# Patient Record
Sex: Male | Born: 1995
Health system: Southern US, Community
[De-identification: ages and names within clinical notes are randomized; demographics above are authoritative.]

## PROBLEM LIST (undated history)

## (undated) DIAGNOSIS — R569 Unspecified convulsions: Secondary | ICD-10-CM

## (undated) HISTORY — PX: OTHER SURGICAL HISTORY: SHX169

---
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
## 2017-01-09 ENCOUNTER — Ambulatory Visit (INDEPENDENT_AMBULATORY_CARE_PROVIDER_SITE_OTHER): Payer: 59 | Admitting: Pediatrics

## 2017-01-17 MED FILL — MIDAZOLAM HCL 5 MG/ML VIAL: 5 | 5 days supply | Qty: 10 | Fill #1

## 2017-01-18 ENCOUNTER — Other Ambulatory Visit: Payer: Self-pay | Admitting: Family

## 2017-01-18 DIAGNOSIS — G40219 Localization-related (focal) (partial) symptomatic epilepsy and epileptic syndromes with complex partial seizures, intractable, without status epilepticus: Secondary | ICD-10-CM

## 2017-01-18 DIAGNOSIS — G40309 Generalized idiopathic epilepsy and epileptic syndromes, not intractable, without status epilepticus: Secondary | ICD-10-CM

## 2017-01-19 ENCOUNTER — Encounter (INDEPENDENT_AMBULATORY_CARE_PROVIDER_SITE_OTHER): Payer: Self-pay | Admitting: Pediatrics

## 2017-01-19 DIAGNOSIS — G40309 Generalized idiopathic epilepsy and epileptic syndromes, not intractable, without status epilepticus: Secondary | ICD-10-CM

## 2017-01-19 DIAGNOSIS — G40219 Localization-related (focal) (partial) symptomatic epilepsy and epileptic syndromes with complex partial seizures, intractable, without status epilepticus: Secondary | ICD-10-CM

## 2017-01-19 MED ORDER — DILANTIN INFATABS 50 MG PO CHEW: CHEWABLE_TABLET | ORAL | 5 refills | Status: DC

## 2017-02-07 ENCOUNTER — Encounter (INDEPENDENT_AMBULATORY_CARE_PROVIDER_SITE_OTHER): Payer: Self-pay | Admitting: Pediatrics

## 2017-02-07 ENCOUNTER — Ambulatory Visit (INDEPENDENT_AMBULATORY_CARE_PROVIDER_SITE_OTHER): Payer: 59 | Admitting: Pediatrics

## 2017-02-07 VITALS — BP 110/80 | HR 72 | Ht 70.25 in | Wt 141.0 lb

## 2017-02-07 DIAGNOSIS — G40219 Localization-related (focal) (partial) symptomatic epilepsy and epileptic syndromes with complex partial seizures, intractable, without status epilepticus: Secondary | ICD-10-CM

## 2017-02-07 DIAGNOSIS — G40211 Localization-related (focal) (partial) symptomatic epilepsy and epileptic syndromes with complex partial seizures, intractable, with status epilepticus: Secondary | ICD-10-CM

## 2017-02-07 DIAGNOSIS — G40309 Generalized idiopathic epilepsy and epileptic syndromes, not intractable, without status epilepticus: Secondary | ICD-10-CM

## 2017-02-07 NOTE — Progress Notes (Signed)
Patient: Garrett [name] MRN: [id number] Sex: male DOB: 10-05-1995  Provider: Ellison Carwin, MD Location of Care: [facility]  Note type: Routine return visit  History of Present Illness: Referral Source: Bonnell Public, MD History from: mother, patient and [facility] chart Chief Complaint: Epilepsy/Sleep Dysfunction  Garrett [name] is a 21 y.o. male who was evaluated on February 07, 2017, for the first time since November 08, 2016.  He has intractable complex partial seizures of short and long duration and a history of generalized tonic-clonic seizures controlled with Dilantin.  Demond takes high doses of oxcarbazepine which has not controlled his seizures.  Dilantin; however, seems to control his generalized tonic-clonic seizures.    In reviewing his record, I noted that he has taken levetiracetam, Depakote, and Onfi, although he was on the latter for only a very short time.  He has had a number of prolonged EEGs which are described below.    Vagal nerve stimulator was implanted in April 2017.  He had an uncharacteristically immediate response with cessation of his prolonged complex partial seizures which unfortunately did not persist.  Overtime, we have increased his stimuli in the vagal nerve stimulator and there is a general sense that he has improved.  However, at least a couple of times a month, he will have a cluster of prolonged complex partial seizures.  A week ago, Monday was characteristic.  He went to bed and around midnight had a 15 minute complex partial seizure that was terminated with Versed.  He fell back asleep and around 1 a.m., he awakened and had another episode that was Caso-limited and had a third at 7 a.m. that was also Towe-limited.  He has not had any further clusters of prolonged complex partial seizures and had not had any in nearly 1 month.  Since his last visit, he came to the office with his mother was instructed on the use of Versed.  This  has been very helpful in controlling his seizures as noted above.    As best I can determine, he had a seizure on December 10, 2016.  It was not clear to me whether or not mother waited for 20 minutes before she administered Versed, but I think that is the case and there was cessation of his symptoms.  On December 20, 2016, he awakened around 5:45 and had seizures where his head would jerk and he would stutter and had difficulty with his breathing.  This continued for 45 minutes before mother gave him Versed.  He went to sleep, awakened around 9:45 and at 12 noon had recurrent seizures which were mild.  He would tense for a second, smack his lips, and change his facial expression.  I told his mother that he could receive a second dose in the day but that she might need to take him to the emergency department.  He also had seizures on January 11, 2017, and January 17, 2017.  His mother says that several times a day he will have staring spells lasting for 10 to 15 seconds that are associated with some lip-smacking.  These are not associated with postictal changes.  His general health is good.  He is sleeping well.  He goes out with his friends fairly frequently.    Interrogation of vagal nerve stimulator. Device: Aspire SR [id number], model [id number], implantation date August 31, 2015.   The device was interrogated and showed an output of 1.11mA, signal frequency total: 20 hertz, pulse width: 250 s,  signal time on: 30 seconds, signal time off: 5 minutes.Autostimulator parmaters: Output current 1.625 mA, time on 60 seconds, pulse width 250 s; Magnet output current: 1.87mA, time on: 60 seconds, the magnet pulse width 250 s. These parameters were not changed. Tachycardia detection was turned on. Heartbeat sensitivity was oneand threshold for stimulation was 40% elevation in heart rate.  Device was reprogrammed to normal output of 1.75 mA, on his stimulator output of 1.875 mA, and magnet output of 2.00  mA  The device was interrogated and showed output current: OK, current delivered: 1.40mA, the impedance: OK, impedance value: 2734ohms, battery status indicator: no need to replace.  Number of magnet stimuli was 710, 51since  November 08 2016.  Matthewtolerated the procedure well. He had no episodes of coughing during programming  Review of Systems: 12 system review was remarkable for staring spells multiple times a week, 3 seizures a week; the remainder was assessed and was negative  Past Medical History Diagnosis Date  . Seizures (HCC)    Hospitalizations: No., Head Injury: No., Nervous System Infections: No., Immunizations up to date: Yes.    He had a closed head injury that was mild in 2009.  He had three EEGs on October 21, 2009, June 22, 2012, and April 17, 2013: all of which were normal. CT scan of the brain in January 2014 was normal. He has experienced generalized tonic-clonic seizures as well lasting from three to eight minutes in duration. I placed him on levetiracetam and his seizures continued.  December, 2014 I recommended placing him on Dilantin to see if we could stop his generalized tonic-clonic seizures and that has worked extremely well.   I also had him evaluated at the [facility] on May 07, 2013. He had a routine baseline EEG, which was normal. He had normal EEG both awake and asleep. However, from 6:32 p.m. to 10:25 p.m. he had a total of 16 episodes that were all similar and would begin with generalized alpha-theta range activity of about 30 microvolts and then evolved over three seconds to high voltage greater than 200 microvolts rhythmic 3 hertz delta range activity lasting up to 10 seconds. Over two to three seconds, the EEG would then return to the waking background. This occurred without any obvious clinical accompaniments.   MRI brain April 24, 2013 without and with contrast was normal  A prolonged video  EEG was carried out in June 2015. He had one episode that was characteristic of his behavior that involved calling out, eyes rolling up, and flailing with his arms. This occurred without any changes in background activity. On the other hand, he had numerous episodes of 8 to 10-second rhythmic delta range activity similar to that noted above that occurred without clinical accompaniments. I did not see the alpha and theta range activity that immediately preceded this.  Prolonged ambulatory EEG September 14-15, 2016 Showed no interictal epileptiform activity in the form of spikes or sharp waves and was normal awake drowsy and asleep.  ER visit in November, 2016 due to seizure activity.   Birth History 8 lbs. 12 oz. infant born at [redacted] weeks gestational age to a 19 year old gravida 2 para 33 male. Gestation was complicated by gestational diabetes requiring a special diet. Labor lasted for 6 hours, was induced. Mother received ep idural anesthesia. Normal spontaneous vaginal delivery. Nursery course was complicated only by skin rash. The child was breast-fed for 2 years. Growth and development was recalled as normal.  Behavior  History none  Surgical History Procedure Laterality Date  . Ambulatory EEG  01/28/2015  . CIRCUMCISION  1999  . CIRCUMCISION  1999   at birth  . OTHER SURGICAL HISTORY     VNS  . URETHRA SURGERY  2003   reconstructive surgery as infant.   Family History family history includes Learning disabilities in his cousin, cousin, and paternal aunt; Liver cancer in his paternal grandmother; SIDS in his sister; Seizures in his father. Family history is negative for migraines, seizures, intellectual disabilities, blindness, deafness, birth defects, chromosomal disorder, or autism.  Social History . Marital status: Single  . Years of education: 61   Social History Main Topics  . Smoking status: Passive Smoke Exposure - Never Smoker  . Smokeless tobacco: Never Used      Comment: Mom smokes outside   . Alcohol use No  . Drug use: No  . 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    Garrett [name] lives with his mother and his brother.    Garrett [name] enjoys music,playing the guitar, video games and school work.   Allergies Allergen Reactions  . Pollen Extract Itching  . Amoxicillin Rash and Hives   Physical Exam BP 110/80   Pulse 72   Ht 5' 10.25" (1.784 m)   Wt 141 lb (64 kg)   BMI 20.09 kg/m   General: alert, well developed, well nourished, in no acute distress, long sandy hair, blue eyes, right handed Head: normocephalic, no dysmorphic features Ears, Nose and Throat: Otoscopic: tympanic membranes normal; pharynx: oropharynx is pink without exudates or tonsillar hypertrophy Neck: supple, full range of motion, no cranial or cervical bruits Respiratory: auscultation clear Cardiovascular: no murmurs, pulses are normal Musculoskeletal: no skeletal deformities or apparent scoliosis Skin: no rashes or neurocutaneous lesions  Neurologic Exam  Mental Status: alert; oriented to person, place and year; knowledge is normal for age; language is normal Cranial Nerves: visual fields are full to double simultaneous stimuli; extraocular movements are full and conjugate; pupils are round reactive to light; funduscopic examination shows sharp disc margins with normal vessels; symmetric facial strength; midline tongue and uvula; air conduction is greater than bone conduction bilaterally Motor: Normal strength, tone and mass; good fine motor movements; no pronator drift Sensory: intact responses to cold, vibration, proprioception and stereognosis Coordination: good finger-to-nose, rapid repetitive alternating movements and finger apposition Gait and Station: normal gait and station: patient is able to walk on heels, toes and tandem without difficulty; balance is adequate; Romberg exam is negative; Gower  response is negative Reflexes: symmetric and diminished bilaterally; no clonus; bilateral flexor plantar responses  Assessment 1. 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 2. 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 3. Generalized convulsive epilepsy, G40.309.  Discussion Unfortunately, with the frequency of his staring spells and his more prolonged complex partial seizures, it seems unlikely that he could have gainful employment.  It has been a long time since we changed his medication.  His last oxcarbazepine level was on July 05, 2016, and was 23.9 mcg/mL.  At that time, he was taking 100 mg of oxcarbazepine in the morning and 1200 at nighttime.  He was on 1050 twice daily for quite some time, but I switched him to the extended release and made his dose asymmetric because of his early morning seizures.  Although his drug level is not top therapeutic, he takes a very large  dose of extended release oxcarbazepine.  Plan I ordered a morning trough 10-hydroxy carbamazepine level.  I did not change any of his medications.  I asked him to return to see me in 3 months.  His vagal nerve stimulator is working well.  I did not increase the output or change the timing frequency.  I talked to the family about Fycompa.  I believe that we should try something else unless his oxcarbazepine level is significantly lower.  I do not think that we can go much higher.  I spent 30 minutes of face-to-face time discussing these issues with the family and making plans for further evaluation.  I also interrogated the vagal nerve stimulator as noted above.   Medication List   Accurate as of 02/07/17 11:34 AM.      diazepam 20 MG Gel Commonly known as:  DIASAT Place rectally.   DILANTIN 100 MG ER capsule Generic drug:  phenytoin TAKE 2 CAPSULES BY MOUTH AT BEDTIME ALONG WITH 1 DILANTIN  TABLET   DILANTIN INFATABS  50 MG tablet Generic drug:  phenytoin CHEW AND SWALLOW 1 TABLET BY MOUTH AT BEDTIME ALONG WITH 100 MG TABSULES   midazolam 5 MG/ML injection Commonly known as:  VERSED Place 2 mLs (10 mg total) into the nose once. Place 1 mL in each nares.  Draw up 1 mL (ml) each in two syringes, then attach syringe to nasal atomizer for intranasal administration.   OXTELLAR XR 300 MG Tb24 Generic drug:  OXcarbazepine ER TAKE 3 TABLETS BY MOUTH EVERY MORNING AND 4 EVERY EVENING    The medication list was reviewed and reconciled. All changes or newly prescribed medications were explained.  A complete medication list was provided to the patient/caregiver.  Deetta Perla MD

## 2017-02-19 ENCOUNTER — Other Ambulatory Visit: Payer: Self-pay | Admitting: Pediatrics

## 2017-02-19 DIAGNOSIS — G40219 Localization-related (focal) (partial) symptomatic epilepsy and epileptic syndromes with complex partial seizures, intractable, without status epilepticus: Secondary | ICD-10-CM

## 2017-02-19 DIAGNOSIS — G40309 Generalized idiopathic epilepsy and epileptic syndromes, not intractable, without status epilepticus: Secondary | ICD-10-CM

## 2017-02-26 LAB — 10-HYDROXYCARBAZEPINE: Triliptal/MTB(Oxcarbazepin): 11.5 ug/mL (ref 8.0–35.0)

## 2017-02-28 ENCOUNTER — Telehealth (INDEPENDENT_AMBULATORY_CARE_PROVIDER_SITE_OTHER): Payer: Self-pay

## 2017-02-28 MED ORDER — OXCARBAZEPINE ER 300 MG PO TB24: ORAL_TABLET | ORAL | 3 refills | Status: DC

## 2017-02-28 NOTE — Telephone Encounter (Signed)
Rx has been printed and placed on Tina's desk 

## 2017-03-03 ENCOUNTER — Telehealth (INDEPENDENT_AMBULATORY_CARE_PROVIDER_SITE_OTHER): Payer: Self-pay | Admitting: Pediatrics

## 2017-03-03 NOTE — Telephone Encounter (Signed)
Oxcarbazepine level dropped to 11.5 mcg/mL from 23.9 for reasons that are unclear.  I placed a call and left a message.  I will send my chart note.  I told mother to call back on Monday.

## 2017-03-04 ENCOUNTER — Encounter (INDEPENDENT_AMBULATORY_CARE_PROVIDER_SITE_OTHER): Payer: Self-pay | Admitting: Pediatrics

## 2017-03-16 MED FILL — MIDAZOLAM HCL 5 MG/ML VIAL: 5 | 5 days supply | Qty: 10 | Fill #2

## 2017-04-10 ENCOUNTER — Encounter (INDEPENDENT_AMBULATORY_CARE_PROVIDER_SITE_OTHER): Payer: Self-pay | Admitting: Pediatrics

## 2017-05-01 ENCOUNTER — Encounter (INDEPENDENT_AMBULATORY_CARE_PROVIDER_SITE_OTHER): Payer: Self-pay | Admitting: Pediatrics

## 2017-05-02 ENCOUNTER — Telehealth (INDEPENDENT_AMBULATORY_CARE_PROVIDER_SITE_OTHER): Payer: Self-pay | Admitting: Pediatrics

## 2017-05-02 NOTE — Telephone Encounter (Signed)
°  Who's calling (name and relationship to patient) : Crystal (mom) Best contact number: [phone] Provider they see: Dr. Sharene SkeansHickling  Reason for call: Mom requesting letter for grandmother that states son's condition and that grandmother is a caretaker and cannot be away for long periods of time due to caring for her son.

## 2017-05-02 NOTE — Telephone Encounter (Signed)
Spoke with Crystal and she informed me that her mom helps her with Garrett HazardMatthew. She states that her mom needs a letter for the [employer], stating that she is Teacher, early years/preMatthew's caretaker and she can not travel for 8 weeks at a time due to his medical condition.

## 2017-05-08 ENCOUNTER — Encounter (INDEPENDENT_AMBULATORY_CARE_PROVIDER_SITE_OTHER): Payer: Self-pay | Admitting: Pediatrics

## 2017-05-08 NOTE — Telephone Encounter (Signed)
Copy of requested letter.

## 2017-05-11 ENCOUNTER — Ambulatory Visit (INDEPENDENT_AMBULATORY_CARE_PROVIDER_SITE_OTHER): Payer: Medicaid Other | Admitting: Pediatrics

## 2017-05-24 ENCOUNTER — Other Ambulatory Visit (INDEPENDENT_AMBULATORY_CARE_PROVIDER_SITE_OTHER): Payer: Self-pay | Admitting: Pediatrics

## 2017-05-24 MED FILL — MIDAZOLAM HCL 5 MG/ML VIAL: 5 | 5 days supply | Qty: 10 | Fill #0

## 2017-05-26 ENCOUNTER — Ambulatory Visit (INDEPENDENT_AMBULATORY_CARE_PROVIDER_SITE_OTHER): Payer: 59 | Admitting: Pediatrics

## 2017-05-26 ENCOUNTER — Encounter (INDEPENDENT_AMBULATORY_CARE_PROVIDER_SITE_OTHER): Payer: Self-pay | Admitting: Pediatrics

## 2017-05-26 VITALS — BP 100/70 | HR 72 | Ht 70.25 in | Wt 148.2 lb

## 2017-05-26 DIAGNOSIS — G40309 Generalized idiopathic epilepsy and epileptic syndromes, not intractable, without status epilepticus: Secondary | ICD-10-CM

## 2017-05-26 DIAGNOSIS — G40219 Localization-related (focal) (partial) symptomatic epilepsy and epileptic syndromes with complex partial seizures, intractable, without status epilepticus: Secondary | ICD-10-CM | POA: Diagnosis not present

## 2017-05-26 MED ORDER — MIDAZOLAM HCL 5 MG/ML IJ SOLN: INTRAMUSCULAR | 5 refills | Status: DC

## 2017-05-26 MED ORDER — DILANTIN INFATABS 50 MG PO CHEW: CHEWABLE_TABLET | ORAL | 5 refills | Status: DC

## 2017-05-26 MED ORDER — DILANTIN 100 MG PO CAPS: ORAL_CAPSULE | ORAL | 5 refills | Status: DC

## 2017-05-26 NOTE — Patient Instructions (Signed)
[facility] and have Garrett [name] scheduled for an evaluation.  This will initially be outpatient but they will hospitalize him for some of the testing if they decide that he is a candidate.

## 2017-05-26 NOTE — Progress Notes (Signed)
Patient: Garrett [name] MRN: [id number] Sex: male DOB: 20-Oct-1995  Provider: Ellison Carwin, MD Location of Care: [facility]  Note type: Routine return visit  History of Present Illness: Referral Source: Berline Lopes, MD History from: mother, patient and [facility] chart Chief Complaint: Epilepsy/Sleep Dysfunction  Shermar Friedland Dematteo is a 22 y.o. male who returns on May 26, 2017, for the first time since February 07, 2017.  He has intractable complex partial seizures that are of both short and long duration and a history of generalized tonic-clonic seizures that have been completely controlled with Dilantin.  Since his last visit, his mother has intermittently provided updates concerning his condition.  His seizures seem to be worse in the morning.  He stiffens up his arms and his facial expression.  He has eyelid blinking and coughing episodes start and stop.  It appears that the VNS may be causing them to stop but they start again.  He sometimes appears to be gagging.  At some point, after this has persisted for several minutes, his mother will treat him with nasal Versed which almost always stops the episodes and then he sleeps.  He has long periods when he does well.    Between February 07, 2017, when I saw him and March 04, 2017, he had a single complex partial seizure.  Then, in the ensuing days, he had a flurry.  The same happened on April 05, 2017, when he had a short episode which went away spontaneously.  On April 06, 2017, he had a bad morning with clusters of partial seizures as described above that required Versed to stop them.  The last flurry that I know about occurred in mid December.  This is considerably better than it used to be, but his seizures prevent him from working and from being able to live independent from his mother.  His 2 most recent seizures were on May 17, 2017, and May 24, 2017.  He estimates that he has 2 to 3 seizures a  week, but his mother's phone calls would not suggest that is the case.  The vagal nerve stimulator still is able to abort some of his seizures, but these clearly are localization related with impairment of consciousness.  His mother asked today whether or not it would be possible to have a more intensive workup for his seizures to see if there is some other treatment that would benefit him.  I told her that that was an excellent idea and that I would discuss a second opinion with my colleagues at [facility].  I do not think that he has ever had a phase 1 trial, although he has had prolonged EEGs at [facility] and an ambulatory study in 2016.  I tested his vagal nerve stimulator today and it is noted below.  Interrogation of vagal nerve stimulator. Device: Aspire SR [id number], model [id number], implantation date August 31, 2015.   The device was interrogated and showed an output of 1.21mA, signal frequency total: 20 hertz, pulse width: 250 s, signal time on: 30 seconds, signal time off: 5 minutes.Auto-stimulator parmaters: Output current 1.875 mA, time on 60 seconds, pulse width 250 s; Magnet output current: 2.45mA, time on: 60 seconds, the magnet pulse width 250 s. These parameters were not changed. Tachycardia detection was turned on. Heartbeat sensitivity was oneand threshold for stimulation was 40% elevation in heart rate.  The device was interrogated and showed output current: OK, current delivered: 1.46mA, the impedance: OK,  impedance value: 2653 ohms, battery status indicator: no need to replace.  Number of magnet stimuli was 730, 20sinceSeptember 25, 2018.  Matthewtolerated the procedure well. He had no episodes of coughing during programming  Review of Systems: A complete review of systems was remarkable for two to three seizures a week, disorientation, magnet does not work at times, VNS adjustment, all other systems reviewed and negative.  Past  Medical History Diagnosis Date  . Seizures (HCC)    Hospitalizations: No., Head Injury: No., Nervous System Infections: No., Immunizations up to date: Yes.    He had a closed head injury that was mild in 2009.  He had three EEGs on October 21, 2009, June 22, 2012, and April 17, 2013: all of which were normal. CT scan of the brain in January 2014 was normal. He has experienced generalized tonic-clonic seizures as well lasting from three to eight minutes in duration. I placed him on levetiracetam and his seizures continued.  December, 2014 I recommended placing him on Dilantin to see if we could stop his generalized tonic-clonic seizures and that has worked extremely well.   I also had him evaluated at the [facility] on May 07, 2013. He had a routine baseline EEG, which was normal. He had normal EEG both awake and asleep. However, from 6:32 p.m. to 10:25 p.m. he had a total of 16 episodes that were all similar and would begin with generalized alpha-theta range activity of about 30 microvolts and then evolved over three seconds to high voltage greater than 200 microvolts rhythmic 3 hertz delta range activity lasting up to 10 seconds. Over two to three seconds, the EEG would then return to the waking background. This occurred without any obvious clinical accompaniments.   MRI brain April 24, 2013 without and with contrast was normal  A prolonged video EEG was carried out in June 2015. He had one episode that was characteristic of his behavior that involved calling out, eyes rolling up, and flailing with his arms. This occurred without any changes in background activity. On the other hand, he had numerous episodes of 8 to 10-second rhythmic delta range activity similar to that noted above that occurred without clinical accompaniments. I did not see the alpha and theta range activity that immediately preceded this.  Prolonged ambulatory EEG  September 14-15, 2016 Showed no interictal epileptiform activity in the form of spikes or sharp waves and was normal awake drowsy and asleep.  ER visit in November, 2016 due to seizure activity.   Birth History 8 lbs. 12 oz. infant born at [redacted] weeks gestational age to a 20 year old gravida 2 para 19 male. Gestation was complicated by gestational diabetes requiring a special diet. Labor lasted for 6 hours, was induced. Mother received ep idural anesthesia. Normal spontaneous vaginal delivery. Nursery course was complicated only by skin rash. The child was breast-fed for 2 years. Growth and development was recalled as normal.  Behavior History none  Surgical History Procedure Laterality Date  . Ambulatory EEG  01/28/2015  . CIRCUMCISION  1999  . CIRCUMCISION  1999   at birth  . OTHER SURGICAL HISTORY     VNS  . URETHRA SURGERY  2003   reconstructive surgery as infant.   Family History family history includes Learning disabilities in his cousin, cousin, and paternal aunt; Liver cancer in his paternal grandmother; SIDS in his sister; Seizures in his father. Family history is negative for migraines, intellectual disabilities, blindness, deafness, birth defects, chromosomal disorder,  or autism.  Social History Social History   Socioeconomic History  . Marital status: Single  . Years of education: 54  . Highest education level:  High School  Social Needs  . Financial resource strain: None  . Food insecurity - worry: None  . Food insecurity - inability: None  . Transportation needs - medical: None  . Transportation needs - non-medical: None  Occupational History  . None  Tobacco Use  . Smoking status: Passive Smoke Exposure - Never Smoker  . Smokeless tobacco: Never Used  . Tobacco comment: Mom smokes outside   Substance and Sexual Activity  . Alcohol use: No    Alcohol/week: 0.0 oz  . Drug use: No  . 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    Emarion lives with his mother and his brother.    Anuel enjoys music,playing the guitar, video games and school work.   Allergies Allergen Reactions  . Pollen Extract Itching  . Amoxicillin Rash and Hives   Physical Exam BP 100/70   Pulse 72   Ht 5' 10.25" (1.784 m)   Wt 148 lb 3.2 oz (67.2 kg)   BMI 21.11 kg/m   General: alert, well developed, well nourished, in no acute distess, long, sandy hair, blue eyes, right handed Head: normocephalic, no dysmorphic features Ears, Nose and Throat: Otoscopic: tympanic membranes normal; pharynx: oropharynx is pink without exudates or tonsillar hypertrophy Neck: supple, full range of motion, no cranial or cervical bruits Respiratory: auscultation clear Cardiovascular: no murmurs, pulses are normal Musculoskeletal: no skeletal deformities or apparent scoliosis Skin: no rashes or neurocutaneous lesions  Neurologic Exam  Mental Status: alert; oriented to person, place and year; knowledge is normal for age; language is normal Cranial Nerves: visual fields are full to double simultaneous stimuli; extraocular movements are full and conjugate; pupils are round reactive to light; funduscopic examination shows sharp disc margins with normal vessels; symmetric facial strength; midline tongue and uvula; air conduction is greater than bone conduction bilaterally Motor: Normal strength, tone and mass; good fine motor movements; no pronator drift Sensory: intact responses to cold, vibration, proprioception and stereognosis Coordination: good finger-to-nose, rapid repetitive alternating movements and finger apposition Gait and Station: normal gait and station: patient is able to walk on heels, toes and tandem without difficulty; balance is adequate; Romberg exam is negative; Gower response is negative Reflexes: symmetric and diminished bilaterally; no clonus; bilateral flexor plantar  responses  Assessment 1. 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  Discussion As noted above, though Kaikoa's seizures are intractable, they have been in general in better control on his current medications plus vagal nerve stimulator.  Plan I will contact my [facility] at [facility] he had his VNS implanted.  He is now 22 years of age and I think will need to be evaluated by the adult service.  I am not going to make any changes in his current medications.  I did not make any change in his vagal nerve stimulator.  He will return to see me in about 4 months' time.  I will see him sooner based on clinical circumstances.  I spent 25 minutes of face-to-face time with Molli Hazard and in addition interrogated his vagal nerve stimulator.   Medication List    Accurate as of 05/26/17 12:03 PM.      diazepam 20 MG Gel Commonly known as:  DIASAT  Place rectally.   DILANTIN 100 MG ER capsule Generic drug:  phenytoin TAKE 2 CAPSULES BY MOUTH AT BEDTIME ALONG WITH 1 DILANTIN 50MG  TABLET   DILANTIN INFATABS 50 MG tablet Generic drug:  phenytoin CHEW AND SWALLOW 1 TABLET BY MOUTH AT BEDTIME ALONG WITH 100 MG TABSULES   midazolam 5 MG/ML injection Commonly known as:  VERSED DRAW UP 1 ML INTO 2 SYRINGES. REMOVE DEVICE & ATTACH ATOMIZER. GIVE 1ML IN EACH NOSTRIL ONCE AS DIRECTED   OXcarbazepine ER 300 MG Tb24 Commonly known as:  OXTELLAR XR TAKE 3 TABLETS BY MOUTH EVERY MORNING AND 4 EVERY EVENING    The medication list was reviewed and reconciled. All changes or newly prescribed medications were explained.  A complete medication list was provided to the patient/caregiver.  Deetta PerlaWilliam H Jaydynn Wolford MD

## 2017-06-06 ENCOUNTER — Encounter (INDEPENDENT_AMBULATORY_CARE_PROVIDER_SITE_OTHER): Payer: Self-pay | Admitting: Pediatrics

## 2017-06-06 DIAGNOSIS — Z79899 Other long term (current) drug therapy: Secondary | ICD-10-CM

## 2017-06-22 ENCOUNTER — Telehealth (INDEPENDENT_AMBULATORY_CARE_PROVIDER_SITE_OTHER): Payer: Self-pay

## 2017-06-22 DIAGNOSIS — G40309 Generalized idiopathic epilepsy and epileptic syndromes, not intractable, without status epilepticus: Secondary | ICD-10-CM

## 2017-06-22 DIAGNOSIS — G40219 Localization-related (focal) (partial) symptomatic epilepsy and epileptic syndromes with complex partial seizures, intractable, without status epilepticus: Secondary | ICD-10-CM

## 2017-06-22 MED ORDER — DILANTIN 100 MG PO CAPS: ORAL_CAPSULE | ORAL | 5 refills | Status: DC

## 2017-06-22 MED FILL — MIDAZOLAM HCL 5 MG/ML VIAL: 5 | 5 days supply | Qty: 10 | Fill #1

## 2017-06-22 NOTE — Telephone Encounter (Signed)
Rx has been printed and placed on Dr. Hickling's desk 

## 2017-06-22 NOTE — Telephone Encounter (Signed)
Rx has been faxed to the pharmacy 

## 2017-07-18 ENCOUNTER — Other Ambulatory Visit (INDEPENDENT_AMBULATORY_CARE_PROVIDER_SITE_OTHER): Payer: Self-pay | Admitting: Pediatrics

## 2017-07-18 MED ORDER — OXCARBAZEPINE ER 300 MG PO TB24: ORAL_TABLET | ORAL | 3 refills | Status: DC

## 2017-07-18 NOTE — Telephone Encounter (Signed)
Rx has been faxed to the pharmacy 

## 2017-07-18 NOTE — Telephone Encounter (Signed)
Rx has been printed and placed on Dr. Darl HouseholderHickling's office

## 2017-07-21 ENCOUNTER — Encounter (INDEPENDENT_AMBULATORY_CARE_PROVIDER_SITE_OTHER): Payer: Self-pay | Admitting: Pediatrics

## 2017-07-21 MED FILL — MIDAZOLAM HCL 5 MG/ML VIAL: 5 | 5 days supply | Qty: 10 | Fill #2

## 2017-07-21 NOTE — Telephone Encounter (Signed)
Tiffanie can you check with the coordinator at [facility] and find out what is happening with our referral?

## 2017-07-30 ENCOUNTER — Encounter (INDEPENDENT_AMBULATORY_CARE_PROVIDER_SITE_OTHER): Payer: Self-pay | Admitting: Pediatrics

## 2017-08-09 ENCOUNTER — Encounter (INDEPENDENT_AMBULATORY_CARE_PROVIDER_SITE_OTHER): Payer: Self-pay | Admitting: Pediatrics

## 2017-08-17 DIAGNOSIS — Z0279 Encounter for issue of other medical certificate: Secondary | ICD-10-CM

## 2017-09-25 ENCOUNTER — Ambulatory Visit (INDEPENDENT_AMBULATORY_CARE_PROVIDER_SITE_OTHER): Payer: 59 | Admitting: Pediatrics

## 2017-10-11 DIAGNOSIS — Z0279 Encounter for issue of other medical certificate: Secondary | ICD-10-CM

## 2017-11-13 ENCOUNTER — Telehealth (INDEPENDENT_AMBULATORY_CARE_PROVIDER_SITE_OTHER): Payer: Self-pay | Admitting: Pediatrics

## 2017-11-13 MED FILL — MIDAZOLAM HCL 5 MG/ML VIAL: 10 | 5 days supply | Qty: 10 | Fill #0

## 2017-11-13 NOTE — Telephone Encounter (Signed)
°  Who's calling (name and relationship to patient) : Judie GrieveBryan Scientist, research (physical sciences)(Pharmacist) Best contact number: [phone] Provider they see: Dr. Sharene SkeansHickling  Reason for call: Judie GrieveBryan has question regarding Midazolam. Please advise.

## 2017-11-13 NOTE — Telephone Encounter (Signed)
I called and spoke with Judie GrieveBryan. He said that the Midazolam 5mg /321ml is on backorder. They can get the 10mg /322ml. I approved that change. The pharmacy will review the dose and administration with Mom when she picks up the refill. TG

## 2017-12-06 ENCOUNTER — Other Ambulatory Visit (INDEPENDENT_AMBULATORY_CARE_PROVIDER_SITE_OTHER): Payer: Self-pay | Admitting: Pediatrics

## 2017-12-06 DIAGNOSIS — G40309 Generalized idiopathic epilepsy and epileptic syndromes, not intractable, without status epilepticus: Secondary | ICD-10-CM

## 2017-12-06 MED ORDER — MIDAZOLAM HCL 10 MG/2ML IJ SOLN: INTRAMUSCULAR | 5 refills | Status: DC

## 2017-12-06 MED FILL — MIDAZOLAM HCL 5 MG/ML VIAL: 10 | 5 days supply | Qty: 10 | Fill #0

## 2017-12-06 NOTE — Telephone Encounter (Signed)
Disregard last message. The rx has been electronically sent to the pharmacy

## 2017-12-06 NOTE — Telephone Encounter (Signed)
Rx has been printed and placed on Dr. Hickling's desk 

## 2017-12-06 NOTE — Telephone Encounter (Signed)
°  Who's calling (name and relationship to patient) : Crystal (Mother) Best contact number: [phone] Provider they see: Dr. Sharene SkeansHickling  Reason for call: Mom requesting refill on emergency med, Versed. Stated a PA is needed. She also stated pt is out of medication and wants to know what she can do about pt having medication today.      PRESCRIPTION REFILL ONLY  Name of prescription: Versed  Pharmacy: [facility]

## 2017-12-23 ENCOUNTER — Other Ambulatory Visit (INDEPENDENT_AMBULATORY_CARE_PROVIDER_SITE_OTHER): Payer: Self-pay | Admitting: Pediatrics

## 2017-12-23 DIAGNOSIS — G40219 Localization-related (focal) (partial) symptomatic epilepsy and epileptic syndromes with complex partial seizures, intractable, without status epilepticus: Secondary | ICD-10-CM

## 2017-12-23 DIAGNOSIS — G40309 Generalized idiopathic epilepsy and epileptic syndromes, not intractable, without status epilepticus: Secondary | ICD-10-CM

## 2017-12-27 ENCOUNTER — Telehealth (INDEPENDENT_AMBULATORY_CARE_PROVIDER_SITE_OTHER): Payer: Self-pay | Admitting: Pediatrics

## 2017-12-27 DIAGNOSIS — G40309 Generalized idiopathic epilepsy and epileptic syndromes, not intractable, without status epilepticus: Secondary | ICD-10-CM

## 2017-12-27 DIAGNOSIS — G40219 Localization-related (focal) (partial) symptomatic epilepsy and epileptic syndromes with complex partial seizures, intractable, without status epilepticus: Secondary | ICD-10-CM

## 2017-12-27 MED ORDER — DILANTIN 100 MG PO CAPS: ORAL_CAPSULE | ORAL | 5 refills | Status: DC

## 2017-12-27 NOTE — Telephone Encounter (Signed)
Rx has been faxed to the pharmacy 

## 2018-01-04 MED FILL — MIDAZOLAM HCL 5 MG/ML VIAL: 10 | 5 days supply | Qty: 10 | Fill #1

## 2018-01-17 ENCOUNTER — Ambulatory Visit (INDEPENDENT_AMBULATORY_CARE_PROVIDER_SITE_OTHER): Payer: 59 | Admitting: Pediatrics

## 2018-01-19 ENCOUNTER — Ambulatory Visit (INDEPENDENT_AMBULATORY_CARE_PROVIDER_SITE_OTHER): Payer: 59 | Admitting: Pediatrics

## 2018-01-19 ENCOUNTER — Encounter (INDEPENDENT_AMBULATORY_CARE_PROVIDER_SITE_OTHER): Payer: Self-pay | Admitting: Pediatrics

## 2018-01-19 VITALS — BP 90/74 | HR 64 | Ht 70.25 in | Wt 141.8 lb

## 2018-01-19 DIAGNOSIS — G40309 Generalized idiopathic epilepsy and epileptic syndromes, not intractable, without status epilepticus: Secondary | ICD-10-CM

## 2018-01-19 DIAGNOSIS — G40219 Localization-related (focal) (partial) symptomatic epilepsy and epileptic syndromes with complex partial seizures, intractable, without status epilepticus: Secondary | ICD-10-CM | POA: Diagnosis not present

## 2018-01-19 MED ORDER — OXCARBAZEPINE ER 300 MG PO TB24: ORAL_TABLET | ORAL | 5 refills | Status: AC

## 2018-01-19 MED ORDER — OXCARBAZEPINE ER 300 MG PO TB24: ORAL_TABLET | ORAL | 5 refills | Status: DC

## 2018-01-19 MED ORDER — DILANTIN INFATABS 50 MG PO CHEW: CHEWABLE_TABLET | ORAL | 5 refills | Status: DC

## 2018-01-19 NOTE — Progress Notes (Signed)
Patient: Garrett [name] MRN: [id number] Sex: male DOB: 07/19/1995  Provider: Ellison Carwin, MD Location of Care: [facility] Child [facility]  Note type: Routine return visit  History of Present Illness: Referral Source: Berline Lopes, MD History from: mother, patient and [facility] chart Chief Complaint: Epilepsy/Sleep Dysfunction  Garrett [name] is a 22 y.o. male who returns on January 19, 2018 for the first time since May 26, 2017.  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  The patient has episodes of unresponsive staring that happened several times a day.  Previous prolonged EEGs were contradictory.  His first in 2014 showed 16 episodes of generalized alpha and theta range activity, which evolved over seconds to high voltage delta range activity lasting 10 seconds with quick return to baseline.  These were thought to represent electrographic seizures but it occurred without clinical accompaniments.  Something quite similar to that happened in a June 2015 study, but all of the episodes were nocturnal.  The third prolonged EEG showed no abnormalities at all.  Garrett [name]'s seizures tend to come in flurries.  I commented on this at last visit in January.  In August, he had no major complex partial seizures.  In September, he had 3 to 4 per week.  Last night he had 1 lasting for a minute, and then few minutes later had a second lasting 5 to 6 minutes that needed to be treated with midazolam with good response.  It did not appear that any of these were related to noncompliance with medical regimen.  He is taking extremely high doses of oxcarbazepine and he does not like the way he feels on the medication.  It is clear that we are going to need to change the medication, but he has a second opinion coming up in early October at [facility].  I would  prefer to wait on their recommendation and after they assess him.    He goes to bed between 10 and 11 o'clock and sleeps until 8 a.m.  Usually he sleeps soundly.  He goes to sleep with the TV on but he claims that it does not keep him awake.  We had set up an appointment for him at [facility] [place].  Mother canceled that and instead set up an appointment at [facility] where he had been seen previously.  He was scheduled for October 1, but that was canceled and rescheduled to October.  As best I can determine other than the current treatment of Dilantin and oxcarbazepine, he has taken Depakote and/or divalproex extended release and levetiracetam.  He was also placed on Onfi for about 4 months.  Unfortunately, his seizures seem to get worse, and the decision was made to switch to oxcarbazepine.  During this time, he also had abdominal discomfort, which was thought to be related to Depakote, but when the medication was tapered, he had more seizures, but his stomach did not feel any better.  In all likelihood, it was the Onfi.  He also has an implanted vagal nerve stimulator which is interrogated and the results are described below.  Interrogation of vagal nerve stimulator. Device: Aspire SR [id number], model [id number], implantation date August 31, 2015.   The device was interrogated and showed an output of 1.62mA, signal frequency total: 20 hertz, pulse width: 250 s, signal time on: 30 seconds, signal time off: 5 minutes.Auto-stimulator parmaters: Output current 1.875 mA, time  on 60 seconds, pulse width 250 s; Magnet output current: 2.33mA, time on: 60 seconds, the magnet pulse width 250 s. These parameters were not changed. Tachycardia detection was turned on. Heartbeat sensitivity was oneand threshold for stimulation was 40% elevation in heart rate.  The device was interrogated and showed output current: OK, current delivered: 1.67mA, the impedance: OK, impedance value: 2625 ohms, battery status  indicator: no need to replace.  The battery is nearly full.  Number of magnet stimuli was902,172 sinceJanuary 11, 2019.  Matthewtolerated the procedure well. He had no episodes of coughing during programming.  Review of Systems: A complete review of systems was remarkable for mom reports that patient has been having small episodes everyday; she also states that patient has seizures every other day. She states that the magnet will stop it for a short period of time but then the seizures come right back, all other systems reviewed and negative.  Past Medical History Diagnosis Date  . Seizures (HCC)    Hospitalizations: No., Head Injury: No., Nervous System Infections: No., Immunizations up to date: Yes.    He had a closed head injury that was mild in 2009.  He had three EEGs on October 21, 2009, June 22, 2012, and April 17, 2013: all of which were normal. CT scan of the brain in January 2014 was normal. He has experienced generalized tonic-clonic seizures as well lasting from three to eight minutes in duration. I placed him on levetiracetam and his seizures continued.  December, 2014 I recommended placing him on Dilantin to see if we could stop his generalized tonic-clonic seizures and that has worked extremely well.   I also had him evaluated at the [facility] on May 07, 2013. He had a routine baseline EEG, which was normal. He had normal EEG both awake and asleep. However, from 6:32 p.m. to 10:25 p.m. he had a total of 16 episodes that were all similar and would begin with generalized alpha-theta range activity of about 30 microvolts and then evolved over three seconds to high voltage greater than 200 microvolts rhythmic 3 hertz delta range activity lasting up to 10 seconds. Over two to three seconds, the EEG would then return to the waking background. This occurred without any obvious clinical accompaniments.   MRI brain April 24, 2013 without and with contrast was normal  A prolonged video EEG was carried out in June 2015. He had one episode that was characteristic of his behavior that involved calling out, eyes rolling up, and flailing with his arms. This occurred without any changes in background activity. On the other hand, he had numerous episodes of 8 to 10-second rhythmic delta range activity similar to that noted above that occurred without clinical accompaniments. I did not see the alpha and theta range activity that immediately preceded this.  Prolonged ambulatory EEG September 14-15, 2016 Showed no interictal epileptiform activity in the form of spikes or sharp waves and was normal awake drowsy and asleep.  ER visit in August, 2016 due to seizure activity.   Birth History 8 lbs. 12 oz. infant born at [redacted] weeks gestational age to a 20 year old gravida 2 para 40 male. Gestation was complicated by gestational diabetes requiring a special diet. Labor lasted for 6 hours, was induced. Mother received ep idural anesthesia. Normal spontaneous vaginal delivery. Nursery course was complicated only by skin rash. The child was breast-fed for 2 years. Growth and development was recalled as normal.  Behavior History none  Surgical History Procedure Laterality Date  . Ambulatory EEG  01/28/2015  . CIRCUMCISION  1999   at birth  . OTHER SURGICAL HISTORY     VNS  . URETHRA SURGERY  2003   reconstructive surgery as infant.   Family History family history includes Learning disabilities in his cousin, cousin, and paternal aunt; Liver cancer in his paternal grandmother; SIDS in his sister; Seizures in his father. Family history is negative for migraines, intellectual disabilities, blindness, deafness, birth defects, chromosomal disorder, or autism.  Social History Socioeconomic History  . Marital status: Single  . Years of education:  74  . Highest education level:  High school graduate  Occupational  History  .  Unemployed due to his frequent seizures  Social Needs  . Financial resource strain: Not on file  . Food insecurity:    Worry: Not on file    Inability: Not on file  . Transportation needs:    Medical: Not on file    Non-medical: Not on file  Tobacco Use  . Smoking status: Passive Smoke Exposure - Never Smoker  . Smokeless tobacco: Never Used  . Tobacco comment: Mom smokes outside   Substance and Sexual Activity  . Alcohol use: No    Alcohol/week: 0.0 standard drinks  . Drug use: No  . 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    Graviel lives with his mother and his brother.    Olen enjoys music,playing the guitar, video games and school work.   Allergies Allergen Reactions  . Pollen Extract Itching  . Amoxicillin Rash and Hives   Physical Exam BP 90/74   Pulse 64   Ht 5' 10.25" (1.784 m)   Wt 141 lb 12.8 oz (64.3 kg)   BMI 20.20 kg/m   General: alert, well developed, well nourished, in no acute distress, long sandy hair with a beard, blue eyes, right handed Head: normocephalic, no dysmorphic features Ears, Nose and Throat: Otoscopic: tympanic membranes normal; pharynx: oropharynx is pink without exudates or tonsillar hypertrophy Neck: supple, full range of motion, no cranial or cervical bruits Respiratory: auscultation clear Cardiovascular: no murmurs, pulses are normal Musculoskeletal: no skeletal deformities or apparent scoliosis Skin: no rashes or neurocutaneous lesions  Neurologic Exam  Mental Status: alert; oriented to person, place and year; knowledge is normal for age; language is normal Cranial Nerves: visual fields are full to double simultaneous stimuli; extraocular movements are full and conjugate; pupils are round reactive to light; funduscopic examination shows sharp disc margins with normal vessels; symmetric facial strength; midline tongue and uvula; air conduction is greater  than bone conduction bilaterally Motor: Normal strength, tone and mass; good fine motor movements; no pronator drift Sensory: intact responses to cold, vibration, proprioception and stereognosis Coordination: good finger-to-nose, rapid repetitive alternating movements and finger apposition Gait and Station: normal gait and station: patient is able to walk on heels, toes and tandem without difficulty; balance is adequate; Romberg exam is negative; Gower response is negative Reflexes: symmetric and diminished bilaterally; no clonus; bilateral flexor plantar responses  Assessment 1. 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  Discussion I am concerned about the frequency of the patient's seizures.  I am also somewhat puzzled as to why they apparently occur in clusters.  I believe that he is compliant with taking his medication.  Drug levels have shown that.  Nonetheless, he does not feel well on the  medication.  It is time to consider other alternatives.  Plan I mentioned medications Aptiom and Briviact.  These are recently introduced medications for localization related seizures.  I am not going to make changes at this time.  He is scheduled to have a second opinion at a tertiary care hospital, hopefully with a phase 1 to workup in the near future.  I am fairly certain that recommendations will come out of this that I will act on.  Dilantin will be continued because it is totally stopped his generalized tonic-clonic seizures without side effects.  He will return to see me in 3 months.  Greater than 50% of a 25 minute visit was spent in counseling and coordination of care concerning his intractable seizures.  In addition, I interrogated without significant reprogramming of the vagal nerve stimulator and determined that it is working well.    Medication List    Accurate as of 01/19/18 11:59 PM.      diazepam 20 MG  Gel Commonly known as:  DIASAT Place rectally.   DILANTIN 100 MG ER capsule Generic drug:  phenytoin TAKE 2 CAPSULES BY MOUTH AT BEDTIME ALONG WITH 1 DILANTIN 50MG  TABLET   DILANTIN INFATABS 50 MG tablet Generic drug:  phenytoin CHEW AND SWALLOW ONE TABLET AT BEDTIME ALONG WITH 100 MG TABLETS   midazolam 10 MG/2ML Soln injection Commonly known as:  VERSED DRAW UP 1 ML INTO 2 SYRINGES. REMOVE DEVICE & ATTACH ATOMIZER. GIVE IN EACH NOSTRIL ONCE AS DIRECTED   OXcarbazepine ER 300 MG Tb24 TAKE 3 TABLETS BY MOUTH EVERY MORNING AND 4 EVERY EVENING    The medication list was reviewed and reconciled. All changes or newly prescribed medications were explained.  A complete medication list was provided to the patient/caregiver.  Deetta Perla MD

## 2018-01-19 NOTE — Patient Instructions (Addendum)
Please have the [facility] doctors send me a note after they see Garrett [name].  I want them to consider a phase 1 evaluation because he is never had one.  Previous prolonged EEGs is suggested a generalized seizure process, but I do not think that is probably the case.  Medications that would be important to try would include Aptiom and Briviact.  He will be somewhat tricky to introduce these wire on oxcarbazepine but we have to do that if we want to make a switch.  We are not going to take away the Dilantin.

## 2018-02-22 ENCOUNTER — Encounter (INDEPENDENT_AMBULATORY_CARE_PROVIDER_SITE_OTHER): Payer: Self-pay | Admitting: Pediatrics

## 2018-03-09 MED FILL — MIDAZOLAM HCL 5 MG/ML VIAL: 10 | 5 days supply | Qty: 10 | Fill #2

## 2018-04-04 MED FILL — MIDAZOLAM HCL 5 MG/ML VIAL: 10 | 5 days supply | Qty: 10 | Fill #3

## 2018-04-20 ENCOUNTER — Ambulatory Visit (INDEPENDENT_AMBULATORY_CARE_PROVIDER_SITE_OTHER): Payer: 59 | Admitting: Pediatrics

## 2018-04-23 ENCOUNTER — Telehealth (INDEPENDENT_AMBULATORY_CARE_PROVIDER_SITE_OTHER): Payer: Self-pay | Admitting: Pediatrics

## 2018-04-23 NOTE — Telephone Encounter (Signed)
°  Who's calling (name and relationship to patient) : Patria [facility]Lynette K [facility]  Best contact number: [phone]  Provider they see: Sharene SkeansHickling  Reason for call: Oxtellar medication denied authorization.    PRESCRIPTION REFILL ONLY  Name of prescription:  Pharmacy:

## 2018-04-24 NOTE — Telephone Encounter (Signed)
Will resubmit the PA

## 2018-05-04 MED FILL — MIDAZOLAM HCL 5 MG/ML VIAL: 10 | 5 days supply | Qty: 10 | Fill #4

## 2018-06-11 ENCOUNTER — Other Ambulatory Visit (INDEPENDENT_AMBULATORY_CARE_PROVIDER_SITE_OTHER): Payer: Self-pay | Admitting: Family

## 2018-06-11 DIAGNOSIS — G40309 Generalized idiopathic epilepsy and epileptic syndromes, not intractable, without status epilepticus: Secondary | ICD-10-CM

## 2018-06-11 MED FILL — MIDAZOLAM HCL 5 MG/ML VIAL: 10 | 5 days supply | Qty: 10 | Fill #0

## 2018-06-11 NOTE — Telephone Encounter (Signed)
°  Who's calling (name and relationship to patient) : (mom)  Crystal Best contact number: [phone] Provider they see: [place]  Reason for call:     PRESCRIPTION REFILL ONLY  Name of prescription: Versed Pharmacy: [facility]

## 2018-06-18 ENCOUNTER — Other Ambulatory Visit (INDEPENDENT_AMBULATORY_CARE_PROVIDER_SITE_OTHER): Payer: Self-pay | Admitting: Pediatrics

## 2018-06-18 DIAGNOSIS — G40219 Localization-related (focal) (partial) symptomatic epilepsy and epileptic syndromes with complex partial seizures, intractable, without status epilepticus: Secondary | ICD-10-CM

## 2018-06-18 DIAGNOSIS — G40309 Generalized idiopathic epilepsy and epileptic syndromes, not intractable, without status epilepticus: Secondary | ICD-10-CM

## 2018-07-18 ENCOUNTER — Other Ambulatory Visit (INDEPENDENT_AMBULATORY_CARE_PROVIDER_SITE_OTHER): Payer: Self-pay | Admitting: Pediatrics

## 2018-07-18 DIAGNOSIS — G40309 Generalized idiopathic epilepsy and epileptic syndromes, not intractable, without status epilepticus: Secondary | ICD-10-CM

## 2018-07-18 DIAGNOSIS — G40219 Localization-related (focal) (partial) symptomatic epilepsy and epileptic syndromes with complex partial seizures, intractable, without status epilepticus: Secondary | ICD-10-CM

## 2018-07-24 MED FILL — MIDAZOLAM HCL 5 MG/ML VIAL: 10 | 5 days supply | Qty: 10 | Fill #1

## 2018-07-30 MED FILL — MIDAZOLAM HCL 5 MG/ML VIAL: 10 | 5 days supply | Qty: 10 | Fill #2

## 2018-08-04 ENCOUNTER — Encounter (INDEPENDENT_AMBULATORY_CARE_PROVIDER_SITE_OTHER): Payer: Self-pay

## 2018-08-31 MED FILL — MIDAZOLAM HCL 5 MG/ML VIAL: 10 | 5 days supply | Qty: 10 | Fill #3

## 2019-03-01 ENCOUNTER — Other Ambulatory Visit (INDEPENDENT_AMBULATORY_CARE_PROVIDER_SITE_OTHER): Payer: Self-pay | Admitting: Pediatrics

## 2019-03-01 DIAGNOSIS — G40309 Generalized idiopathic epilepsy and epileptic syndromes, not intractable, without status epilepticus: Secondary | ICD-10-CM

## 2019-03-01 MED FILL — MIDAZOLAM HCL 5 MG/ML VIAL: 5 | 1 days supply | Qty: 2 | Fill #0

## 2020-09-17 ENCOUNTER — Encounter (INDEPENDENT_AMBULATORY_CARE_PROVIDER_SITE_OTHER): Payer: Self-pay

## 2021-11-26 ENCOUNTER — Ambulatory Visit: Payer: BC Managed Care – PPO | Admitting: Family Medicine

## 2022-02-06 ENCOUNTER — Emergency Department (HOSPITAL_BASED_OUTPATIENT_CLINIC_OR_DEPARTMENT_OTHER): Payer: BC Managed Care – PPO | Admitting: Radiology

## 2022-02-06 ENCOUNTER — Emergency Department (HOSPITAL_BASED_OUTPATIENT_CLINIC_OR_DEPARTMENT_OTHER)
Admission: EM | Admit: 2022-02-06 | Discharge: 2022-02-06 | Disposition: A | Discharge status: Home / Self Care | Payer: BC Managed Care – PPO | Attending: Emergency Medicine | Admitting: Emergency Medicine

## 2022-02-06 ENCOUNTER — Other Ambulatory Visit: Payer: Self-pay

## 2022-02-06 DIAGNOSIS — W19XXXA Unspecified fall, initial encounter: Secondary | ICD-10-CM | POA: Diagnosis not present

## 2022-02-06 DIAGNOSIS — S2241XA Multiple fractures of ribs, right side, initial encounter for closed fracture: Secondary | ICD-10-CM | POA: Diagnosis not present

## 2022-02-06 DIAGNOSIS — S299XXA Unspecified injury of thorax, initial encounter: Secondary | ICD-10-CM | POA: Diagnosis present

## 2022-02-06 NOTE — ED Triage Notes (Signed)
Pt states he fell in the bathroom 1 month ago during an epileptic seizure. Pt c/o right sided rib pain.

## 2022-02-06 NOTE — ED Provider Notes (Signed)
[place] EMERGENCY DEPT Provider Note   CSN: [id number] Arrival date & time: 02/06/22  1547     History  Chief Complaint  Patient presents with   Rib Injury    Garrett [name] is a 26 y.o. male who presents to the emergency department with concerns for right rib injury onset 1 month ago.  Notes that during an epileptic seizure and he fell and his right side hit the corner of the vanity.  He has not been evaluated for his symptoms.  Was given Advil without relief of his symptoms.  Denies chest pain, shortness of breath, nausea, vomiting.    The history is provided by the patient. No language interpreter was used.       Home Medications Prior to Admission medications   Medication Sig Start Date End Date Taking? Authorizing Provider  diazepam (DIASAT) 20 MG GEL Place rectally. 05/08/13   [provider]  DILANTIN 100 MG ER capsule TAKE 2 CAPSULES BY MOUTH AT BEDTIME, ALONG WITH 1 DILANTIN 50 MG CAPSULE 06/18/18   Hickling, Princess Bruins, MD  DILANTIN INFATABS 50 MG tablet CHEW AND SWALLOW ONE TABLET BY MOUTH AT BEDTIME ALONG WITH 100 MG TABLET AS DIRECTED 07/18/18   Jodi Geralds, MD  midazolam PF (VERSED) 10 MG/2ML SOLN injection DRAW UP 1 ML INTO 2 SYRINGES. REMOVE DEVICE AND ATTACH ATOMIZER. GIVE 1 ML IN EACH NOSTRIL ONCE AS DIRECTED 03/01/19   Jodi Geralds, MD  OXcarbazepine ER (OXTELLAR XR) 300 MG TB24 TAKE 3 TABLETS BY MOUTH EVERY MORNING AND 4 EVERY EVENING 01/19/18   Jodi Geralds, MD      Allergies    Pollen extract and Amoxicillin    Review of Systems   Review of Systems  Respiratory:  Negative for shortness of breath.   Cardiovascular:  Negative for chest pain.  Gastrointestinal:  Negative for nausea and vomiting.  Musculoskeletal:  Positive for arthralgias.  All other systems reviewed and are negative.   Physical Exam Updated Vital Signs BP 112/73   Pulse 64   Temp 97.6 F (36.4 C)   Resp 18   Ht 5\' 10"  (1.778 m)    Wt 56.7 kg   SpO2 100%   BMI 17.94 kg/m  Physical Exam Vitals and nursing note reviewed.  Constitutional:      General: He is not in acute distress.    Appearance: Normal appearance.  Eyes:     General: No scleral icterus.    Extraocular Movements: Extraocular movements intact.  Cardiovascular:     Rate and Rhythm: Normal rate and regular rhythm.     Pulses: Normal pulses.     Heart sounds: Normal heart sounds.  Pulmonary:     Effort: Pulmonary effort is normal. No respiratory distress.     Breath sounds: Normal breath sounds.  Chest:     Comments: No chest wall TTP. Abdominal:     Palpations: Abdomen is soft. There is no mass.     Tenderness: There is no abdominal tenderness.  Musculoskeletal:        General: Normal range of motion.     Cervical back: Neck supple.     Comments: tenderness to palpation noted to right lateral ribs without overlying skin changes.  No obvious deformity noted on exam.   Skin:    General: Skin is warm and dry.     Findings: No rash.  Neurological:     Mental Status: He is alert.     Sensory: Sensation  is intact.     Motor: Motor function is intact.  Psychiatric:        Behavior: Behavior normal.     ED Results / Procedures / Treatments   Labs (all labs ordered are listed, but only abnormal results are displayed) Labs Reviewed - No data to display  EKG None  Radiology DG Ribs Unilateral W/Chest Right  Result Date: 02/06/2022 CLINICAL DATA:  Right rib pain EXAM: RIGHT RIBS AND CHEST - 3+ VIEW COMPARISON:  02/24/2014 FINDINGS: Healing nondisplaced fractures of the lateral right eighth, ninth, and tenth ribs. There is developing callus formation at the fracture sites. There is no evidence of pneumothorax or pleural effusion. Both lungs are clear. Heart size and mediastinal contours are within normal limits. Left chest wall implanted device with lead extending to the base of the neck on the left. IMPRESSION: Healing nondisplaced fractures of  the lateral right eighth, ninth, and tenth ribs. No pneumothorax. Electronically Signed   By: Duanne Guess D.O.   On: 02/06/2022 16:24    Procedures Procedures    Medications Ordered in ED Medications - No data to display  ED Course/ Medical Decision Making/ A&P                            Medical Decision Making Amount and/or Complexity of Data Reviewed Radiology: ordered.   Pt presents with right rib injury onset 1 month.  Has tried Advil at home with no relief of his symptoms. Vital signs, patient afebrile. On exam, pt with tenderness to palpation noted to right lateral ribs without overlying skin changes.  No obvious deformity noted on exam. No acute cardiovascular, respiratory exam findings. Differential diagnosis includes fracture, dislocation, contusion.    Additional history obtained:  Additional history obtained from Parent  Imaging: I ordered imaging studies including right unilateral rib with chest x-ray I independently visualized and interpreted imaging which showed: Healing nondisplaced fractures to eighth, ninth, 10th ribs I agree with the radiologist interpretation  Disposition: Presentation suspicious for healing fractures of the ribs on the right side.  Doubt dislocation, contusion at this time. After consideration of the diagnostic results and the patients response to treatment, I feel that the patient would benefit from Discharge home.  Patient provided with incentive spirometer today in the emergency department.  Discussed with pharmacy regarding pain control for patient, review of, medications such as Percocet and hydrocodone noted to increase CNS depression when taken with Dilantin.  Recommended against tramadol or Toradol along with Dilantin due to decreasing the seizure threshold.  Discussed this thoroughly with patient and mother at bedside.  Family agreeable to Tylenol and ibuprofen at this time.  Supportive care measures and strict return precautions  discussed with patient at bedside. Pt acknowledges and verbalizes understanding. Pt appears safe for discharge. Follow up as indicated in discharge paperwork.    This chart was dictated using voice recognition software, Dragon. Despite the best efforts of this provider to proofread and correct errors, errors may still occur which can change documentation meaning.   Final Clinical Impression(s) / ED Diagnoses Final diagnoses:  Closed fracture of multiple ribs of right side, initial encounter    Rx / DC Orders ED Discharge Orders     None         Dnaiel Voller A, PA-C 02/06/22 1938    Alvira Monday, MD 02/07/22 2239

## 2022-02-06 NOTE — Discharge Instructions (Addendum)
It was a pleasure taking care of you today!   Your xray showed healing nondisplaced fractures of your right 8th, 9th, and 10th ribs. You may take over the counter tylenol and ibuprofen for your symptoms. You may place ice to the affected area for up to 15 minutes at a time, ensure to place a barrier between your skin and ice. Return to the ED if your symptoms are worsening.

## 2022-02-06 NOTE — ED Notes (Signed)
RT educated pt on proper use of IS. Pt able to perform w/out difficulty. Pt verbalizes understanding.

## 2022-10-05 ENCOUNTER — Ambulatory Visit (INDEPENDENT_AMBULATORY_CARE_PROVIDER_SITE_OTHER): Payer: BC Managed Care – PPO

## 2022-10-05 ENCOUNTER — Ambulatory Visit
Admission: EM | Admit: 2022-10-05 | Discharge: 2022-10-05 | Disposition: A | Discharge status: Home / Self Care | Payer: BC Managed Care – PPO | Attending: Physician Assistant | Admitting: Physician Assistant

## 2022-10-05 ENCOUNTER — Encounter: Payer: Self-pay | Admitting: Emergency Medicine

## 2022-10-05 DIAGNOSIS — S93402A Sprain of unspecified ligament of left ankle, initial encounter: Secondary | ICD-10-CM

## 2022-10-05 NOTE — Discharge Instructions (Signed)
Tylenol or ibuprofen for discomfort.  Return if any problems.

## 2022-10-05 NOTE — ED Provider Notes (Signed)
[facility]    CSN: [id number] Arrival date & time: 10/05/22  1839      History   Chief Complaint No chief complaint on file.   HPI Garrett [name] is a 27 y.o. male.   Patient reports he fell down the steps injuring his left foot and ankle.  Patient complains of swelling and pain.  Patient has pain with walking.  Patient took ibuprofen before coming in  The history is provided by the patient. No language interpreter was used.    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       Home Medications    Prior to Admission medications   Medication Sig Start Date End Date Taking? Authorizing Provider  diazepam (DIASAT) 20 MG GEL Place rectally. 05/08/13   [provider]  DILANTIN 100 MG ER capsule TAKE 2 CAPSULES BY MOUTH AT BEDTIME, ALONG WITH 1 DILANTIN 50 MG CAPSULE 06/18/18   Hickling, Deanna Artis, MD  DILANTIN INFATABS 50 MG tablet CHEW AND SWALLOW ONE TABLET BY MOUTH AT BEDTIME ALONG WITH 100 MG TABLET AS DIRECTED 07/18/18   Deetta Perla, MD  midazolam PF (VERSED) 10 MG/2ML SOLN injection DRAW UP 1 ML INTO 2 SYRINGES. REMOVE DEVICE AND ATTACH ATOMIZER. GIVE 1 ML IN EACH NOSTRIL ONCE AS DIRECTED 03/01/19   Deetta Perla, MD   OXcarbazepine ER (OXTELLAR XR) 300 MG TB24 TAKE 3 TABLETS BY MOUTH EVERY MORNING AND 4 EVERY EVENING 01/19/18   Deetta Perla, MD    Family History Family History  Problem Relation Age of Onset   Seizures Father        Likely had seizures    Learning disabilities Cousin        Paternal 1st cousin has learning differences   Learning disabilities Cousin        Paternal 1st cousin has learning differences   SIDS Sister        Died at 1 month of age in Oct 26, 2005   Liver cancer Paternal Grandmother        Died at 13   Learning disabilities Paternal Aunt        Has learning differences    Social History Social History   Tobacco Use   Smoking status: Passive Smoke Exposure - Never Smoker   Smokeless tobacco: Never   Tobacco comments:    Mom smokes outside   Substance Use Topics   Alcohol use: No    Alcohol/week: 0.0 standard drinks of alcohol   Drug use: No     Allergies   Pollen extract and Amoxicillin   Review of Systems Review of Systems  Musculoskeletal:  Positive for joint swelling.  All other systems reviewed and are negative.    Physical Exam Triage Vital Signs ED Triage  Vitals  Enc Vitals Group     BP 10/05/22 1844 99/68     Pulse Rate 10/05/22 1844 88     Resp 10/05/22 1844 18     Temp 10/05/22 1844 98 F (36.7 C)     Temp Source 10/05/22 1844 Oral     SpO2 10/05/22 1844 97 %     Weight --      Height --      Head Circumference --      Peak Flow --      Pain Score 10/05/22 1846 10     Pain Loc --      Pain Edu? --      Excl. in GC? --    No data found.  Updated Vital Signs BP 99/68 (BP Location: Left Arm)   Pulse 88   Temp 98 F (36.7 C) (Oral)   Resp 18   SpO2 97%   Visual Acuity Right Eye Distance:   Left Eye Distance:   Bilateral Distance:    Right Eye Near:   Left Eye Near:    Bilateral Near:     Physical Exam Vitals and nursing note reviewed.  Constitutional:      Appearance: He is well-developed.  HENT:     Head:  Normocephalic.  Cardiovascular:     Rate and Rhythm: Normal rate.  Pulmonary:     Effort: Pulmonary effort is normal.  Abdominal:     General: There is no distension.  Musculoskeletal:        General: Swelling and tenderness present.     Cervical back: Normal range of motion.  Skin:    General: Skin is warm.  Neurological:     General: No focal deficit present.     Mental Status: He is alert and oriented to person, place, and time.      UC Treatments / Results  Labs (all labs ordered are listed, but only abnormal results are displayed) Labs Reviewed - No data to display  EKG   Radiology No results found.  Procedures Procedures (including critical care time)  Medications Ordered in UC Medications - No data to display  Initial Impression / Assessment and Plan / UC Course  I have reviewed the triage vital signs and the nursing notes.  Pertinent labs & imaging results that were available during my care of the patient were reviewed by me and considered in my medical decision making (see chart for details).     MDM:  pt placed in an aso and given crutches.  Final Clinical Impressions(s) / UC Diagnoses   Final diagnoses:  Moderate ankle sprain, left, initial encounter     Discharge Instructions      Tylenol or ibuprofen for discomfort.  Return if any problems.    ED Prescriptions   None    PDMP not reviewed this encounter. An After Visit Summary was printed and given to the patient.       Elson Areas, [place] 10/05/22 1947

## 2022-10-05 NOTE — ED Triage Notes (Signed)
Fell down steps. Pain to left foot and ankle.  Swelling noted to ankle.

## 2023-01-27 ENCOUNTER — Encounter (HOSPITAL_BASED_OUTPATIENT_CLINIC_OR_DEPARTMENT_OTHER): Payer: Self-pay | Admitting: Emergency Medicine

## 2023-01-27 ENCOUNTER — Other Ambulatory Visit: Payer: Self-pay

## 2023-01-27 ENCOUNTER — Emergency Department (HOSPITAL_BASED_OUTPATIENT_CLINIC_OR_DEPARTMENT_OTHER)
Admission: EM | Admit: 2023-01-27 | Discharge: 2023-01-27 | Disposition: A | Discharge status: Home / Self Care | Payer: BC Managed Care – PPO | Attending: Emergency Medicine | Admitting: Emergency Medicine

## 2023-01-27 DIAGNOSIS — R338 Other retention of urine: Secondary | ICD-10-CM

## 2023-01-27 DIAGNOSIS — G8918 Other acute postprocedural pain: Secondary | ICD-10-CM | POA: Insufficient documentation

## 2023-01-27 DIAGNOSIS — R339 Retention of urine, unspecified: Secondary | ICD-10-CM | POA: Diagnosis present

## 2023-01-27 NOTE — ED Provider Notes (Signed)
[place] EMERGENCY DEPARTMENT AT [facility] Provider Note   CSN: [id number] Arrival date & time: 01/27/23  1642     History  Chief Complaint  Patient presents with   Post-op Problem   Urinary Retention    Garrett [name] is a 27 y.o. male.  Pt indicates post surgery earlier today (vagal nerve stimulator battery exchange) that he has not been able to urinate. States happened one prior time also after surgery/anesthesia. No other recent gu symptoms. No dysuria or hematuria.  No fever or chills. Feels full/distended in suprapubic area. No other abdominal or flank pain. No scrotal or testicular pain or swelling.   The history is provided by the patient and medical records.       Home Medications Prior to Admission medications   Medication Sig Start Date End Date Taking? Authorizing Provider  diazepam (DIASAT) 20 MG GEL Place rectally. 05/08/13   [provider]  DILANTIN 100 MG ER capsule TAKE 2 CAPSULES BY MOUTH AT BEDTIME, ALONG WITH 1 DILANTIN 50 MG CAPSULE 06/18/18   Hickling, Deanna Artis, MD  DILANTIN INFATABS 50 MG tablet CHEW AND SWALLOW ONE TABLET BY MOUTH AT BEDTIME ALONG WITH 100 MG TABLET AS DIRECTED 07/18/18   Deetta Perla, MD  midazolam PF (VERSED) 10 MG/2ML SOLN injection DRAW UP 1 ML INTO 2 SYRINGES. REMOVE DEVICE AND ATTACH ATOMIZER. GIVE 1 ML IN EACH NOSTRIL ONCE AS DIRECTED 03/01/19   Deetta Perla, MD  OXcarbazepine ER (OXTELLAR XR) 300 MG TB24 TAKE 3 TABLETS BY MOUTH EVERY MORNING AND 4 EVERY EVENING 01/19/18   Deetta Perla, MD      Allergies    Pollen extract and Amoxicillin    Review of Systems   Review of Systems  Constitutional:  Negative for chills and fever.  Respiratory:  Negative for shortness of breath.   Cardiovascular:  Negative for chest pain.  Gastrointestinal:  Negative for diarrhea and vomiting.  Genitourinary:  Negative for dysuria, flank pain and testicular pain.  Musculoskeletal:  Negative for back  pain.    Physical Exam Updated Vital Signs BP 121/74 (BP Location: Right Arm)   Pulse 78   Temp 97.6 F (36.4 C) (Temporal)   Resp 18   SpO2 100%  Physical Exam Vitals and nursing note reviewed.  Constitutional:      Appearance: Normal appearance. He is well-developed.  HENT:     Head: Atraumatic.     Nose: Nose normal.     Mouth/Throat:     Mouth: Mucous membranes are moist.  Eyes:     General: No scleral icterus.    Conjunctiva/sclera: Conjunctivae normal.  Neck:     Trachea: No tracheal deviation.  Cardiovascular:     Rate and Rhythm: Normal rate and regular rhythm.     Pulses: Normal pulses.  Pulmonary:     Effort: Pulmonary effort is normal. No accessory muscle usage or respiratory distress.  Abdominal:     General: Bowel sounds are normal. There is no distension.     Palpations: Abdomen is soft. There is no mass.     Tenderness: There is no abdominal tenderness. There is no guarding.  Genitourinary:    Comments: No cva tenderness. No scrotal or testicular pain, swelling, or tenderness.  Musculoskeletal:        General: No swelling.     Cervical back: Neck supple.  Skin:    General: Skin is warm and dry.     Findings: No rash.  Neurological:  Mental Status: He is alert.     Comments: Alert, speech clear. Steady gait.   Psychiatric:        Mood and Affect: Mood normal.     ED Results / Procedures / Treatments   Labs (all labs ordered are listed, but only abnormal results are displayed) Labs Reviewed - No data to display  EKG None  Radiology No results found.  Procedures Procedures    Medications Ordered in ED Medications - No data to display  ED Course/ Medical Decision Making/ A&P                                 Medical Decision Making Problems Addressed: Postoperative urinary retention: acute illness or injury   Reviewed nursing notes and prior charts for additional history.   Bladder scan ~ 600 cc urine.   Foley to be  inserted.   Reviewed nursing notes and prior charts for additional history.   Po fluids.   Prior to foley, patient able to urinate, felt emptied bladder. No current symptoms. No abd pain. Abd soft nt.   Pt currently appears stable for d/c.   Return precautions provided.           Final Clinical Impression(s) / ED Diagnoses Final diagnoses:  None    Rx / DC Orders ED Discharge Orders     None         Cathren Laine, MD 01/27/23 [id number]

## 2023-01-27 NOTE — ED Triage Notes (Signed)
Pt reports surgery this morning for vagus nerve stimulator and has not urinated since then.  He has hx of same after surgery.  They did not place a foley during surgery.

## 2023-01-27 NOTE — ED Notes (Addendum)
Reviewed discharge instructions and home care with pt. Pt verbalized understanding and had no further questions. Pt exited ED without complications.

## 2023-01-27 NOTE — ED Notes (Signed)
Refusing foley at this time. Encouraged to keep urinating to completely empty bladder. Is able to void several more time.

## 2023-01-27 NOTE — ED Notes (Signed)
Bladder scan shows . Felt he last fully voided at 0900 today.

## 2023-01-27 NOTE — Discharge Instructions (Signed)
It was our pleasure to provide your ER care today - we hope that you feel better.  Drink plenty of fluids/stay well hydrated.   Return to ER if worse, new symptoms, fevers, new/severe pain, unable to void, persistent vomiting, or other concern.

## 2023-01-27 NOTE — ED Notes (Signed)
Bladder scanned pt showed 233 ml, pt is trying to use urinate again.

## 2024-06-06 ENCOUNTER — Emergency Department (HOSPITAL_BASED_OUTPATIENT_CLINIC_OR_DEPARTMENT_OTHER)

## 2024-06-06 ENCOUNTER — Encounter (HOSPITAL_BASED_OUTPATIENT_CLINIC_OR_DEPARTMENT_OTHER): Payer: Self-pay | Admitting: Emergency Medicine

## 2024-06-06 ENCOUNTER — Other Ambulatory Visit: Payer: Self-pay

## 2024-06-06 ENCOUNTER — Emergency Department (HOSPITAL_BASED_OUTPATIENT_CLINIC_OR_DEPARTMENT_OTHER)
Admission: EM | Admit: 2024-06-06 | Discharge: 2024-06-06 | Disposition: A | Discharge status: Home / Self Care | Source: Ambulatory Visit | Attending: Emergency Medicine | Admitting: Emergency Medicine

## 2024-06-06 DIAGNOSIS — J101 Influenza due to other identified influenza virus with other respiratory manifestations: Secondary | ICD-10-CM | POA: Insufficient documentation

## 2024-06-06 DIAGNOSIS — J1000 Influenza due to other identified influenza virus with unspecified type of pneumonia: Secondary | ICD-10-CM

## 2024-06-06 DIAGNOSIS — R509 Fever, unspecified: Secondary | ICD-10-CM | POA: Diagnosis present

## 2024-06-06 DIAGNOSIS — R109 Unspecified abdominal pain: Secondary | ICD-10-CM | POA: Diagnosis not present

## 2024-06-06 DIAGNOSIS — R3915 Urgency of urination: Secondary | ICD-10-CM | POA: Diagnosis not present

## 2024-06-06 DIAGNOSIS — R300 Dysuria: Secondary | ICD-10-CM | POA: Diagnosis not present

## 2024-06-06 DIAGNOSIS — J189 Pneumonia, unspecified organism: Secondary | ICD-10-CM | POA: Insufficient documentation

## 2024-06-06 LAB — URINALYSIS, ROUTINE W REFLEX MICROSCOPIC
Bacteria, UA: NONE SEEN
Bilirubin Urine: NEGATIVE
Glucose, UA: NEGATIVE mg/dL
Ketones, ur: 15 mg/dL — AB
Leukocytes,Ua: NEGATIVE
Nitrite: NEGATIVE
Protein, ur: 30 mg/dL — AB
Specific Gravity, Urine: 1.017 (ref 1.005–1.030)
pH: 6 (ref 5.0–8.0)

## 2024-06-06 MED ORDER — ALBUTEROL SULFATE HFA 108 (90 BASE) MCG/ACT IN AERS: 1.0000 | INHALATION_SPRAY | Freq: Four times a day (QID) | RESPIRATORY_TRACT | 0 refills | Status: AC | PRN

## 2024-06-06 MED ORDER — DOXYCYCLINE HYCLATE 100 MG PO TABS: 100.0000 mg | ORAL_TABLET | Freq: Two times a day (BID) | ORAL | Status: DC

## 2024-06-06 MED ORDER — DOXYCYCLINE HYCLATE 100 MG PO TABS
100.0000 mg | ORAL_TABLET | Freq: Once | ORAL | Status: AC
  Administered 2024-06-06: 100 mg via ORAL
  Filled 2024-06-06: qty 1

## 2024-06-06 MED ORDER — DOXYCYCLINE HYCLATE 100 MG PO CAPS: 100.0000 mg | ORAL_CAPSULE | Freq: Two times a day (BID) | ORAL | 0 refills | Status: AC

## 2024-06-06 MED ORDER — LIDOCAINE HCL (PF) 1 % IJ SOLN
1.0000 mL | Freq: Once | INTRAMUSCULAR | Status: AC
  Administered 2024-06-06: 1.5 mL
  Filled 2024-06-06: qty 5

## 2024-06-06 MED ORDER — CEFTRIAXONE SODIUM 500 MG IJ SOLR
500.0000 mg | Freq: Once | INTRAMUSCULAR | Status: AC
  Administered 2024-06-06: 500 mg via INTRAMUSCULAR
  Filled 2024-06-06: qty 500

## 2024-06-06 NOTE — ED Triage Notes (Addendum)
 Patient reports flu like symptoms and UTI symptoms.  Patient seen at [facility] today and tested positive for the flu, states he was unable to give urine sample at [facility]. Patient received tylenol  at [facility].

## 2024-06-06 NOTE — ED Provider Notes (Signed)
 " [place] EMERGENCY DEPARTMENT AT [facility] Provider Note   CSN: [id number] Arrival date & time: 06/06/24  8078     Patient presents with: Influenza and Dysuria   Garrett [name] is a 29 y.o. male. With pertinent medical history of epilepsy.   Patient is here for evaluation of flulike symptoms and dysuria.  Patient is accompanied by his mother today. Patient was seen earlier today at local urgent care for evaluation of fever, urinary urgency, dysuria, and bodyaches.  He tested positive for influenza A however he was unable to produce a urine sample.  He was recommended to come to our facility for further evaluation secondary to urinary retention and tachycardia.  Patient was able to produce urine for urinalysis at our facility and was in the bathroom urinating again when I initially stopped by to gather history.  Patient denies penile discharge or bleeding.  He denies rash or change in skin to the groin.  He reports last sexual intercourse encounter was several months ago. Reports sexual intercourse exclusively with women. Denies history of STI.   Amoxicillin allergy: Hives while he was a baby. No recent exposures.  The history is provided by the patient and a parent.  Influenza Dysuria Presenting symptoms: dysuria        Prior to Admission medications  Medication Sig Start Date End Date Taking? Authorizing Provider  albuterol  (VENTOLIN  HFA) 108 (90 Base) MCG/ACT inhaler Inhale 1-2 puffs into the lungs every 6 (six) hours as needed for wheezing or shortness of breath. 06/06/24  Yes Rosina Almarie LABOR, PA-C  doxycycline  (VIBRAMYCIN ) 100 MG capsule Take 1 capsule (100 mg total) by mouth 2 (two) times daily. 06/06/24  Yes Rosina Almarie A, PA-C  diazepam (DIASAT) 20 MG GEL Place rectally. 05/08/13   [provider]  DILANTIN  100 MG ER capsule TAKE 2 CAPSULES BY MOUTH AT BEDTIME, ALONG WITH 1 DILANTIN  50 MG CAPSULE 06/18/18   Hickling, Elsie DEL, MD  DILANTIN   INFATABS 50 MG tablet CHEW AND SWALLOW ONE TABLET BY MOUTH AT BEDTIME ALONG WITH 100 MG TABLET AS DIRECTED 07/18/18   Susen Elsie DEL, MD  midazolam  PF (VERSED ) 10 MG/2ML SOLN injection DRAW UP 1 ML INTO 2 SYRINGES. REMOVE DEVICE AND ATTACH ATOMIZER. GIVE 1 ML IN EACH NOSTRIL ONCE AS DIRECTED 03/01/19   Susen Elsie DEL, MD  OXcarbazepine  ER (OXTELLAR XR ) 300 MG TB24 TAKE 3 TABLETS BY MOUTH EVERY MORNING AND 4 EVERY EVENING 01/19/18   Susen Elsie DEL, MD    Allergies: Pollen extract and Amoxicillin    Review of Systems  Genitourinary:  Positive for dysuria.   Vitals:   06/06/24 1945 06/06/24 2000 06/06/24 2115 06/06/24 2130  BP: 106/89 (!) 101/55 118/66 105/67  Pulse: (!) 102 90 89   Resp:   13 11  Temp:      TempSrc:      SpO2: 99% 98% 95%   Weight:        Updated Vital Signs BP 105/67   Pulse 89   Temp 99.9 F (37.7 C) (Oral)   Resp 11   Wt 59 kg   SpO2 95%   BMI 18.65 kg/m   Physical Exam Vitals and nursing note reviewed.  Constitutional:      General: He is not in acute distress.    Appearance: Normal appearance. He is normal weight. He is not ill-appearing, toxic-appearing or diaphoretic.     Comments: Intermittent cough during exam  HENT:     Head: Normocephalic  and atraumatic.     Right Ear: Tympanic membrane, ear canal and external ear normal.     Left Ear: Tympanic membrane, ear canal and external ear normal.     Nose: Rhinorrhea present.     Mouth/Throat:     Mouth: Mucous membranes are moist.  Eyes:     General: No scleral icterus.    Extraocular Movements: Extraocular movements intact.     Conjunctiva/sclera: Conjunctivae normal.  Cardiovascular:     Rate and Rhythm: Normal rate and regular rhythm.     Pulses: Normal pulses.     Heart sounds: Normal heart sounds.  Pulmonary:     Effort: Pulmonary effort is normal. No respiratory distress.     Breath sounds: No stridor. Wheezing (LLL) and rhonchi (RLL) present. No rales.  Abdominal:      General: Abdomen is flat. Bowel sounds are normal. There is no distension.     Palpations: Abdomen is soft.     Tenderness: There is no abdominal tenderness. There is no right CVA tenderness, left CVA tenderness or guarding.  Musculoskeletal:        General: Normal range of motion.     Cervical back: Normal range of motion.     Right lower leg: No edema.     Left lower leg: No edema.  Skin:    General: Skin is warm and dry.     Capillary Refill: Capillary refill takes less than 2 seconds.     Coloration: Skin is not jaundiced or pale.  Neurological:     Mental Status: He is alert and oriented to person, place, and time.     (all labs ordered are listed, but only abnormal results are displayed) Labs Reviewed  URINALYSIS, ROUTINE W REFLEX MICROSCOPIC - Abnormal; Notable for the following components:      Result Value   Hgb urine dipstick TRACE (*)    Ketones, ur 15 (*)    Protein, ur 30 (*)    All other components within normal limits    EKG: EKG Interpretation Date/Time:  Thursday June 06 2024 21:12:58 EST Ventricular Rate:  96 PR Interval:  125 QRS Duration:  109 QT Interval:  349 QTC Calculation: 441 R Axis:   88  Text Interpretation: Sinus rhythm RSR' in V1 or V2, right VCD or RVH ST elev, probable normal early repol pattern no sig change from previos Confirmed by Armenta Canning [phone]) on 06/06/2024 10:01:12 PM  Radiology: CT Renal Stone Study Result Date: 06/06/2024 EXAM: CT ABDOMEN AND PELVIS WITHOUT CONTRAST 06/06/2024 09:06:12 PM TECHNIQUE: CT of the abdomen and pelvis was performed without the administration of intravenous contrast. Multiplanar reformatted images are provided for review. Automated exposure control, iterative reconstruction, and/or weight-based adjustment of the mA/kV was utilized to reduce the radiation dose to as low as reasonably achievable. COMPARISON: 02/04/2014 CLINICAL HISTORY: Abdominal/flank pain, stone suspected. Abdominal and flank pain,  stone suspected. FINDINGS: LOWER CHEST: Areas of airspace disease in the lower lobes bilaterally, left greater than right, compatible with pneumonia. LIVER: The liver is unremarkable. GALLBLADDER AND BILE DUCTS: Gallbladder is unremarkable. No biliary ductal dilatation. SPLEEN: No acute abnormality. PANCREAS: No acute abnormality. ADRENAL GLANDS: No acute abnormality. KIDNEYS, URETERS AND BLADDER: No stones in the kidneys or ureters. No hydronephrosis. No perinephric or periureteral stranding. Urinary bladder is unremarkable. GI AND BOWEL: Stomach demonstrates no acute abnormality. There is no bowel obstruction. Normal appendix. PERITONEUM AND RETROPERITONEUM: No ascites. No free air. VASCULATURE: Aorta is normal in caliber. LYMPH NODES:  No lymphadenopathy. REPRODUCTIVE ORGANS: No acute abnormality. BONES AND SOFT TISSUES: No acute osseous abnormality. No focal soft tissue abnormality. IMPRESSION: 1. No acute findings in the abdomen or pelvis. 2. Areas of airspace disease in the lower lobes bilaterally, left greater than right, compatible with pneumonia. Electronically signed by: Franky Crease MD 06/06/2024 09:18 PM EST RP Workstation: [id number]   DG Chest Portable 1 View Result Date: 06/06/2024 EXAM: 1 VIEW(S) XRAY OF THE CHEST 06/06/2024 07:49:00 PM COMPARISON: 02/06/2022 CLINICAL HISTORY: shortness of breath Shortness of breath. FINDINGS: LINES, TUBES AND DEVICES: Left chest wall implantable device again noted, unchanged. Leads extend into the neck, stable. LUNGS AND PLEURA: Minimal right basilar density, favor atelectasis. No confluent airspace opacities or effusions. No pneumothorax. HEART AND MEDIASTINUM: No acute abnormality of the cardiac and mediastinal silhouettes. BONES AND SOFT TISSUES: No acute osseous abnormality. IMPRESSION: 1. Minimal right basilar atelectasis. Electronically signed by: Franky Crease MD 06/06/2024 07:59 PM EST RP Workstation: [id number]     Procedures   Medications Ordered in  the ED  cefTRIAXone  (ROCEPHIN ) injection 500 mg (500 mg Intramuscular Given 06/06/24 2227)  lidocaine  (PF) (XYLOCAINE ) 1 % injection 1-2.1 mL (1.5 mLs Other Given 06/06/24 2227)  doxycycline  (VIBRA -TABS) tablet 100 mg (100 mg Oral Given 06/06/24 2226)    Patient presents to the ED for concern of flulike symptoms and dysuria, this involves an extensive number of treatment options, and is a complaint that carries with it a high risk of complications and morbidity.  The differential diagnosis includes viral URI, influenza, COVID, RSV, pneumonia, UTI, pyelonephritis, kidney stone, STI.    Co morbidities that complicate the patient evaluation  Epilepsy   Additional history obtained:  Additional history obtained from Family and Outside Medical Records   External records from outside source obtained and reviewed including review of urgent care visit note from earlier today.   Lab Tests:  I Ordered, and personally interpreted labs.  The pertinent results include: Urinalysis not indicative of infection.   Imaging Studies ordered:  I ordered imaging studies including chest x-ray and CT renal study I independently visualized and interpreted imaging which showed no acute abnormalities on x-ray though minimal right basilar atelectasis seen.  CT renal study showed no acute findings in the abdomen or pelvis.  It did show areas of airspace disease in the lower lobes bilaterally compatible with pneumonia. I agree with the radiologist interpretation   Cardiac Monitoring:  The patient was maintained on a cardiac monitor.  I personally viewed and interpreted the cardiac monitored which showed an underlying rhythm of: Sinus tach with ST elevation V1 through V3, probable normal early repolarization pattern, and similar to previous EKG studies.   Medicines ordered and prescription drug management:  I ordered medication including IM Rocephin  and p.o. Doxy for pneumonia and suspected STI Reevaluation of  the patient after these medicines showed that the patient stayed the same I have reviewed the patients home medicines and have made adjustments as needed   Problem List / ED Course:     Pneumonia. Tested positive for influenza A at urgent care earlier today. Physical exam, vitals, and history are consistent with viral URI.  Slight wheeze heard in left lower lobe and rhonchi in right lower lobe.  Chest x-ray with no acute findings.  CT renal study did show findings consistent with pneumonia in bilateral lower lobes.  Spoke with patient and mother about diagnosis, outpatient treatment, return precautions and they verbalized understanding. Empiric antibiotics started in ED and sent to pharmacy.  Albuterol  inhaler sent to pharmacy. Stable for discharge.  Dysuria.  Urinalysis not indicative of infection.  No evidence of kidney stone on CT renal study.   Patient is not interested in STI testing today. Cannot exclude STI as source of dysuria. Will select antibiotic with coverage for both pneumonia and most common STIs.  Encouraged follow-up with primary care for ongoing evaluation of dysuria.  Also recommended potential for outpatient urology consultation if dysuria persists.   Reevaluation:  After the interventions noted above, I reevaluated the patient and found that they have :improved   Dispostion:  After consideration of the diagnostic results and the patients response to treatment, I feel that the patent would benefit from supportive care in the home setting with over-the-counter medications for influenza symptoms and antibiotics sent to pharmacy for pneumonia.  Close follow-up with primary care physician for ongoing evaluation of improvement of symptoms as well as further evaluation of dysuria.  Consideration for outpatient urology appointment if dysuria persists.  Return precautions given.                                  Medical Decision Making Amount and/or Complexity of Data  Reviewed Labs: ordered. Radiology: ordered.  Risk Prescription drug management.   This note was produced using Electronics Engineer. While the provider has reviewed and verified all clinical information, transcription errors may remain.    Final diagnoses:  Pneumonia due to influenza A virus  Dysuria    ED Discharge Orders          Ordered    doxycycline  (VIBRAMYCIN ) 100 MG capsule  2 times daily        06/06/24 2226    albuterol  (VENTOLIN  HFA) 108 (90 Base) MCG/ACT inhaler  Every 6 hours PRN        06/06/24 2227               Rosina Almarie DELENA DEVONNA 06/07/24 0107    Armenta Canning, MD 06/10/24 [phone]  "

## 2024-06-06 NOTE — Progress Notes (Signed)
 Subjective   Patient ID:  Garrett [name] is a 29 y.o. (DOB 1996-04-06) male.   Chief Complaint  Patient presents with   Fever    Fever, urgency, dysuria, body aches. Exposed to sibling with flu     29 year old male coming in with mother at bedside reporting 7 days of worsening fevers, chills, generalized bodyaches and fatigue, productive cough, poor appetite, shortness of breath, urinary urgency/frequency/burning with urination, bilateral low back pain, nasal congestion and runny nose.  Reports multiple household members with similar symptoms who tested positive for influenza.  He has tried various over-the-counter medications with no improvement of his fevers as they are worsening and persisting not improving.  Denies any trouble swallowing or voice changes, chest pain/pressure/palpitation/tightness, dizziness/lightheadedness, abdominal/pelvic/flank pain, testicular pain or swelling, blood in urine, penile discharge, changes in weight.  Reports that he urinates frequently but only very small amounts at a time  Which is new and has been going on for the past 1-2 days.  Denies any other complaints.  Fever  Associated symptoms include congestion and urinary pain. Pertinent negatives include no abdominal pain, chest pain, coughing, diarrhea, ear pain, headaches, nausea, rash, sore throat, vomiting or wheezing.     Review of Systems Review of Systems  Constitutional:  Positive for appetite change, chills, fatigue and fever. Negative for activity change, diaphoresis and unexpected weight change.  HENT:  Positive for congestion, postnasal drip and rhinorrhea. Negative for dental problem, drooling, ear discharge, ear pain, facial swelling, hearing loss, mouth sores, nosebleeds, sinus pressure, sinus pain, sneezing, sore throat, tinnitus, trouble swallowing and voice change.   Eyes: Negative.  Negative for photophobia, pain, discharge, redness, itching and visual disturbance.  Respiratory: Negative.   Negative for apnea, cough, choking, chest tightness, shortness of breath, wheezing and stridor.   Cardiovascular: Negative.  Negative for chest pain, palpitations and leg swelling.  Gastrointestinal: Negative.  Negative for abdominal distention, abdominal pain, anal bleeding, blood in stool, constipation, diarrhea, nausea, rectal pain and vomiting.  Endocrine: Negative.   Genitourinary:  Positive for decreased urine volume, dysuria, frequency and urgency. Negative for difficulty urinating, enuresis, flank pain, genital sores, hematuria, penile discharge, penile pain, penile swelling, scrotal swelling and testicular pain.  Musculoskeletal:  Positive for myalgias. Negative for arthralgias, back pain, neck pain and neck stiffness.  Skin: Negative.  Negative for color change, pallor, rash and wound.  Neurological: Negative.  Negative for dizziness, tremors, seizures, syncope, facial asymmetry, speech difficulty, weakness, light-headedness, numbness and headaches.  Hematological: Negative.  Negative for adenopathy. Does not bruise/bleed easily.  Psychiatric/Behavioral: Negative.  Negative for confusion.     Objective   BP 119/82 (BP Location: Left Upper Arm, Patient Position: Sitting)   Pulse 109   Temp 100.7 F (38.2 C) (Tympanic)   Resp 17   Ht 5' 10 (1.778 m)   Wt 130 lb 9.6 oz (59.2 kg)   SpO2 95%   BMI 18.74 kg/m   Physical Exam Vitals and nursing note reviewed. Exam conducted with a chaperone present.  Constitutional:      General: He is awake. He is not in acute distress.    Appearance: Normal appearance. He is well-developed, well-groomed and normal weight. He is ill-appearing. He is not toxic-appearing or diaphoretic.  HENT:     Head: Normocephalic and atraumatic. No raccoon eyes, Battle's sign, abrasion, contusion, masses, right periorbital erythema, left periorbital erythema or laceration. Hair is normal.     Jaw: There is normal jaw occlusion. No trismus, tenderness, swelling,  pain  on movement or malocclusion.     Salivary Glands: Right salivary gland is not diffusely enlarged or tender. Left salivary gland is not diffusely enlarged or tender.     Right Ear: Hearing, tympanic membrane, ear canal and external ear normal. No decreased hearing noted. No laceration, drainage, swelling or tenderness. No middle ear effusion. There is no impacted cerumen. No foreign body. No mastoid tenderness. No PE tube. No hemotympanum. Tympanic membrane is not injected, scarred, perforated, erythematous, retracted or bulging.     Left Ear: Hearing, tympanic membrane, ear canal and external ear normal. No decreased hearing noted. No laceration, drainage, swelling or tenderness.  No middle ear effusion. There is no impacted cerumen. No foreign body. No mastoid tenderness. No PE tube. No hemotympanum. Tympanic membrane is not injected, scarred, perforated, erythematous, retracted or bulging.     Nose: Congestion and rhinorrhea present. No nasal deformity, septal deviation, signs of injury, laceration, nasal tenderness or mucosal edema. Rhinorrhea is clear.     Right Nostril: No foreign body, epistaxis, septal hematoma or occlusion.     Left Nostril: No foreign body, epistaxis, septal hematoma or occlusion.     Right Turbinates: Not enlarged, swollen or pale.     Left Turbinates: Not enlarged, swollen or pale.     Right Sinus: No maxillary sinus tenderness or frontal sinus tenderness.     Left Sinus: No maxillary sinus tenderness or frontal sinus tenderness.     Mouth/Throat:     Lips: Pink. No lesions.     Mouth: Mucous membranes are moist. No injury, lacerations, oral lesions or angioedema.     Dentition: Normal dentition. Does not have dentures. No dental tenderness, gingival swelling, dental abscesses or gum lesions.     Tongue: No lesions. Tongue does not deviate from midline.     Palate: No mass and lesions.     Pharynx: Oropharynx is clear. Uvula midline. No pharyngeal swelling,  oropharyngeal exudate, posterior oropharyngeal erythema or uvula swelling.     Tonsils: No tonsillar exudate or tonsillar abscesses.  Eyes:     General: Lids are normal. Vision grossly intact. Gaze aligned appropriately. No visual field deficit or scleral icterus.       Right eye: No foreign body, discharge or hordeolum.        Left eye: No foreign body, discharge or hordeolum.     Extraocular Movements: Extraocular movements intact.     Right eye: Normal extraocular motion and no nystagmus.     Left eye: Normal extraocular motion and no nystagmus.     Conjunctiva/sclera: Conjunctivae normal.     Right eye: Right conjunctiva is not injected. No chemosis, exudate or hemorrhage.    Left eye: Left conjunctiva is not injected. No chemosis, exudate or hemorrhage.    Pupils: Pupils are equal, round, and reactive to light. Pupils are equal.     Right eye: Pupil is round, reactive and not sluggish.     Left eye: Pupil is round, reactive and not sluggish.     Visual Fields: Right eye visual fields normal and left eye visual fields normal.  Neck:     Thyroid: No thyroid mass, thyromegaly or thyroid tenderness.     Vascular: Normal carotid pulses. No carotid bruit, hepatojugular reflux or JVD.     Trachea: Trachea and phonation normal. No tracheal tenderness, tracheostomy, abnormal tracheal secretions or tracheal deviation.  Cardiovascular:     Rate and Rhythm: Normal rate and regular rhythm. No extrasystoles are present.  Chest Wall: PMI is not displaced. No thrill.     Pulses: Normal pulses. No decreased pulses.          Carotid pulses are 2+ on the right side and 2+ on the left side.      Radial pulses are 2+ on the right side and 2+ on the left side.       Popliteal pulses are 2+ on the right side and 2+ on the left side.       Dorsalis pedis pulses are 2+ on the right side and 2+ on the left side.       Posterior tibial pulses are 2+ on the right side and 2+ on the left side.     Heart  sounds: Normal heart sounds, S1 normal and S2 normal. Heart sounds not distant. No murmur heard.    No systolic murmur is present.     No diastolic murmur is present.     No friction rub. No gallop. No S3 or S4 sounds.  Musculoskeletal:        General: Normal range of motion.     Cervical back: Normal, full passive range of motion without pain, normal range of motion and neck supple. No swelling, edema, deformity, erythema, signs of trauma, lacerations, rigidity, spasms, torticollis, tenderness, bony tenderness or crepitus. No pain with movement, spinous process tenderness or muscular tenderness. Normal range of motion.     Thoracic back: Normal. No swelling, edema, deformity, signs of trauma, lacerations, spasms, tenderness or bony tenderness. Normal range of motion. No scoliosis.     Lumbar back: Normal. No swelling, edema, deformity, signs of trauma, lacerations, spasms, tenderness or bony tenderness. Normal range of motion. Negative right straight leg raise test and negative left straight leg raise test. No scoliosis.     Right lower leg: No edema.     Left lower leg: No edema.  Pulmonary:     Effort: Pulmonary effort is normal. No tachypnea, bradypnea, accessory muscle usage, prolonged expiration, respiratory distress or retractions.     Breath sounds: Normal air entry. No stridor, decreased air movement or transmitted upper airway sounds. Examination of the right-upper field reveals wheezing and rhonchi. Examination of the left-upper field reveals rhonchi. Wheezing and rhonchi present. No decreased breath sounds or rales.  Chest:     Chest wall: No tenderness.  Abdominal:     General: Abdomen is flat. Bowel sounds are normal. There is no distension or abdominal bruit. There are no signs of injury.     Palpations: Abdomen is soft. There is no shifting dullness, fluid wave, hepatomegaly, splenomegaly, mass or pulsatile mass.     Tenderness: There is no abdominal tenderness. There is no right  CVA tenderness, left CVA tenderness, guarding or rebound. Negative signs include Murphy's sign.     Hernia: No hernia is present.  Lymphadenopathy:     Head:     Right side of head: No submental, submandibular, tonsillar, preauricular, posterior auricular or occipital adenopathy.     Left side of head: No submental, submandibular, tonsillar, preauricular, posterior auricular or occipital adenopathy.     Cervical: No cervical adenopathy.     Right cervical: No superficial, deep or posterior cervical adenopathy.    Left cervical: No superficial, deep or posterior cervical adenopathy.     Upper Body:     Right upper body: No supraclavicular adenopathy.     Left upper body: No supraclavicular adenopathy.  Skin:    General: Skin is warm.  Capillary Refill: Capillary refill takes less than 2 seconds.     Coloration: Skin is not ashen, cyanotic, jaundiced, mottled, pale or sallow.     Findings: No abrasion, abscess, acne, bruising, burn, ecchymosis, erythema, signs of injury, laceration, lesion, petechiae, rash or wound. Rash is not crusting, macular, nodular, papular, purpuric, pustular, scaling, urticarial or vesicular.     Nails: There is no clubbing.  Neurological:     General: No focal deficit present.     Mental Status: He is alert and oriented to person, place, and time. Mental status is at baseline.     Cranial Nerves: Cranial nerves are intact. No cranial nerve deficit.     Sensory: Sensation is intact. No sensory deficit.     Motor: Motor function is intact. No weakness.     Coordination: Coordination is intact. Coordination normal.     Gait: Gait is intact. Gait normal.     Deep Tendon Reflexes: Reflexes are normal and symmetric. Reflexes normal.  Psychiatric:        Attention and Perception: Attention and perception normal. He is attentive.        Mood and Affect: Mood and affect normal.        Speech: Speech normal.        Behavior: Behavior normal. Behavior is cooperative.         Thought Content: Thought content normal.        Cognition and Memory: Cognition and memory normal.        Judgment: Judgment normal.      Medical Decision Making   Problems Addressed:  1. Fever, unspecified fever cause  POCT CoVid-19 nucleic acid   POCT Flu Nucleic Acid   acetaminophen  (TYLENOL ) tablet 650 mg    2. Dysuria  POCT CoVid-19 nucleic acid   POCT Flu Nucleic Acid   POC Urine Dipstick    3. Flu      4. Urinary retention           Data Reviewed and Analyzed:    Encounter orders Orders Placed This Encounter  Procedures   POCT CoVid-19 nucleic acid   POCT Flu Nucleic Acid   POC Urine Dipstick   Administrations This Visit     acetaminophen  (TYLENOL ) tablet 650 mg     Admin Date 06/06/2024 Action Given Dose 650 mg Route Oral Documented By Nat Postin            Lab results if available Recent Results (from the past 12 hours)  POCT CoVid-19 nucleic acid   Collection Time: 06/06/24  6:25 PM  Result Value Ref Range   SARS-COV-2, NAA Negative Negative  POCT Flu Nucleic Acid   Collection Time: 06/06/24  6:26 PM  Result Value Ref Range   Influenza A/B A Positive (A) Negative    Imaging results if available No results found.    Patient Management:  Patient is ill-appearing, nontoxic with asymptomatic tachycardia(109), fever 100.7 Fahrenheit with good SpO2 was on room air between 95 and 97%, normotensive 119/82.  After numerous attempts at trying to provide urine sample patient reports he is unable to urinate after numerous bottles and cups of water.  Patient reports he does have the desire to urinate but is not able to.  Furthermore his influenza test was positive today but there is concern for secondary bacterial infection as it relates to his urinary tract and lungs.  Given his constellation of symptoms and clinical firefighter instructed patient and mother to go directly to  the nearest emergency department immediately without  delay for further evaluation and management.  They expressed understanding and agreement and will go directly to [facility] health ER on [address] and [address].  Patient is ambulatory with an independent, steady, stable gait and in no acute distress.  Risks, benefits, and alternatives of the medications (OTC and or prescribed) and treatment plan prescribed today were discussed, and patient expressed understanding. Plan follow-up as discussed and as needed if they developed any new or worsening signs/symptoms or change in condition in anything they deem necessary for further evaluation. Discussed with patient their conditions and management of them as well as any prescription/OTC medications regarding this visit. Discussed with patient Red Flag signs/symptoms necessitating the need to seek immediate medical attention at their nearest Emergency Department or by calling 911. The patient was informed that they would be contacted with results of any tests including lab work or imaging whether normal or abnormal.  If they have not received these results within 2 to 3 days of the visit or procedure they need to contact us  or their primary care doctor to make sure these results are relayed to them. They were also informed to retrieve their results via MyChart if active and d/w patient navigation of MyChart. They expressed understanding and their questions were answered.   Medications Administered, Prescribed or Modified Administrations This Visit     acetaminophen  (TYLENOL ) tablet 650 mg     Admin Date 06/06/2024 Action Given Dose 650 mg Route Oral Documented By Nat Postin             Patient's Medications       * Accurate as of June 06, 2024  7:14 PM. Reflects encounter med changes as of last refresh          Continued Medications      Instructions  cenobamate 50 mg Tabs tablet Commonly known as: XCOPRI  150 mg, Oral, Daily   DILANTIN  30 MG ER capsule Generic  drug: phenytoin  sodium  30 mg, Oral, Every evening   NAYZILAM  5 MG/0.1ML Soln Generic drug: Midazolam   SMARTSIG:Both Nares   ofloxacin 0.3% ophthalmic solution Commonly known as: OCUFLOX  1 drop, Both Eyes, 4 times a day        Patient or parent/guardian verbalized understanding regarding plan and followup.  Follow up for GO TO THE ED.   The following instructions were provided and discussed with the patient (or parent/guardian) as part of his care plan. Patient Instructions  Go directly to the nearest emergency department immediately.    *Some images could not be shown.

## 2024-06-06 NOTE — Discharge Instructions (Addendum)
 It was a pleasure meeting with you today.  As we discussed there is no obvious infection seen on urinalysis today.  There were no kidney stones found on CT renal study.  CT renal study did show pneumonia within your lungs.  We started you on antibiotics in the ED and have sent additional antibiotics to your pharmacy as well as an albuterol  inhaler to be used as needed for shortness of breath.   We did not find any acute findings to explain your dysuria (pain when you urinate). Continue to monitor these symptoms and follow up with your primary care physician for ongoing evaluation. If dysuria continues, I recommend scheduling an appointment with outpatient urology for ongoing evaluation and management.   Please return to ED if symptoms worsen or any new concerning symptoms develop.  Read the instructions below on reasons to return to the emergency department and to learn more about your diagnosis.  Use over the counter medications for symptomatic relief as we discussed (musinex as a decongestant, Tylenol  for fever/pain, Motrin /Ibuprofen  for muscle aches). Followup with your primary care doctor in 4 days if your symptoms persist.  You're more than welcome to return to the emergency department if symptoms worsen or become concerning.  Upper Respiratory Infection, Adult  An upper respiratory infection (URI) is also sometimes known as the common cold. Most people improve within 1 week, but symptoms can last up to 2 weeks. A residual cough may last even longer.   URI is most commonly caused by a virus. Viruses are NOT treated with antibiotics. You can easily spread the virus to others by oral contact. This includes kissing, sharing a glass, coughing, or sneezing. Touching your mouth or nose and then touching a surface, which is then touched by another person, can also spread the virus.   TREATMENT  Treatment is directed at relieving symptoms. There is no cure. Antibiotics are not effective, because the  infection is caused by a virus, not by bacteria. Treatment may include:  Increased fluid intake. Sports drinks offer valuable electrolytes, sugars, and fluids.  Breathing heated mist or steam (vaporizer or shower).  Eating chicken soup or other clear broths, and maintaining good nutrition.  Getting plenty of rest.  Using gargles or lozenges for comfort.  Controlling fevers with ibuprofen  or acetaminophen  as directed by your caregiver.  Increasing usage of your inhaler if you have asthma.  Return to work when your temperature has returned to normal.   SEEK MEDICAL CARE IF:  After the first few days, you feel you are getting worse rather than better.  You develop worsening shortness of breath, or brown or red sputum. These may be signs of pneumonia.  You develop yellow or brown nasal discharge or pain in the face, especially when you bend forward. These may be signs of sinusitis.  You develop a fever, swollen neck glands, pain with swallowing, or white areas in the back of your throat. These may be signs of strep throat.

## 2024-06-06 NOTE — ED Notes (Signed)
 Ambulatory to restroom
